# Patient Record
Sex: Male | Born: 1965 | Race: White | Hispanic: No | Marital: Single | Smoking: Current every day smoker
Health system: Southern US, Community
[De-identification: ages and names within clinical notes are randomized; demographics above are authoritative.]

## PROBLEM LIST (undated history)

## (undated) DIAGNOSIS — Z915 Personal history of self-harm: Secondary | ICD-10-CM

## (undated) DIAGNOSIS — F1920 Other psychoactive substance dependence, uncomplicated: Secondary | ICD-10-CM

## (undated) DIAGNOSIS — F319 Bipolar disorder, unspecified: Secondary | ICD-10-CM

## (undated) DIAGNOSIS — F259 Schizoaffective disorder, unspecified: Secondary | ICD-10-CM

## (undated) HISTORY — PX: ANKLE FRACTURE SURGERY: SHX122

---

## 2014-10-23 DIAGNOSIS — Z9151 Personal history of suicidal behavior: Secondary | ICD-10-CM

## 2014-10-23 HISTORY — DX: Personal history of suicidal behavior: Z91.51

## 2015-02-23 ENCOUNTER — Encounter (HOSPITAL_COMMUNITY): Payer: Self-pay | Admitting: *Deleted

## 2015-02-23 ENCOUNTER — Emergency Department (HOSPITAL_COMMUNITY): Payer: Medicare HMO

## 2015-02-23 ENCOUNTER — Inpatient Hospital Stay (HOSPITAL_COMMUNITY): Payer: Medicare HMO

## 2015-02-23 ENCOUNTER — Inpatient Hospital Stay (HOSPITAL_COMMUNITY)
Admission: EM | Admit: 2015-02-23 | Discharge: 2015-03-25 | DRG: 020 | Disposition: E | Payer: Medicare HMO | Attending: Neurology | Admitting: Neurology

## 2015-02-23 DIAGNOSIS — R451 Restlessness and agitation: Secondary | ICD-10-CM | POA: Diagnosis not present

## 2015-02-23 DIAGNOSIS — Z89611 Acquired absence of right leg above knee: Secondary | ICD-10-CM | POA: Diagnosis not present

## 2015-02-23 DIAGNOSIS — R4020 Unspecified coma: Secondary | ICD-10-CM | POA: Diagnosis not present

## 2015-02-23 DIAGNOSIS — Q282 Arteriovenous malformation of cerebral vessels: Secondary | ICD-10-CM | POA: Diagnosis not present

## 2015-02-23 DIAGNOSIS — Z452 Encounter for adjustment and management of vascular access device: Secondary | ICD-10-CM

## 2015-02-23 DIAGNOSIS — F319 Bipolar disorder, unspecified: Secondary | ICD-10-CM | POA: Diagnosis present

## 2015-02-23 DIAGNOSIS — J9600 Acute respiratory failure, unspecified whether with hypoxia or hypercapnia: Secondary | ICD-10-CM | POA: Diagnosis present

## 2015-02-23 DIAGNOSIS — Z515 Encounter for palliative care: Secondary | ICD-10-CM | POA: Diagnosis present

## 2015-02-23 DIAGNOSIS — G911 Obstructive hydrocephalus: Secondary | ICD-10-CM | POA: Diagnosis present

## 2015-02-23 DIAGNOSIS — Z9289 Personal history of other medical treatment: Secondary | ICD-10-CM

## 2015-02-23 DIAGNOSIS — I619 Nontraumatic intracerebral hemorrhage, unspecified: Secondary | ICD-10-CM | POA: Diagnosis present

## 2015-02-23 DIAGNOSIS — I611 Nontraumatic intracerebral hemorrhage in hemisphere, cortical: Secondary | ICD-10-CM | POA: Diagnosis not present

## 2015-02-23 DIAGNOSIS — I739 Peripheral vascular disease, unspecified: Secondary | ICD-10-CM

## 2015-02-23 DIAGNOSIS — I729 Aneurysm of unspecified site: Secondary | ICD-10-CM

## 2015-02-23 DIAGNOSIS — G969 Disorder of central nervous system, unspecified: Secondary | ICD-10-CM

## 2015-02-23 DIAGNOSIS — G936 Cerebral edema: Secondary | ICD-10-CM | POA: Diagnosis present

## 2015-02-23 DIAGNOSIS — I10 Essential (primary) hypertension: Secondary | ICD-10-CM | POA: Diagnosis present

## 2015-02-23 DIAGNOSIS — G8194 Hemiplegia, unspecified affecting left nondominant side: Secondary | ICD-10-CM | POA: Diagnosis present

## 2015-02-23 DIAGNOSIS — Z79899 Other long term (current) drug therapy: Secondary | ICD-10-CM | POA: Diagnosis not present

## 2015-02-23 DIAGNOSIS — I67848 Other cerebrovascular vasospasm and vasoconstriction: Secondary | ICD-10-CM | POA: Diagnosis not present

## 2015-02-23 DIAGNOSIS — I609 Nontraumatic subarachnoid hemorrhage, unspecified: Secondary | ICD-10-CM | POA: Diagnosis not present

## 2015-02-23 DIAGNOSIS — I615 Nontraumatic intracerebral hemorrhage, intraventricular: Secondary | ICD-10-CM | POA: Diagnosis present

## 2015-02-23 DIAGNOSIS — I629 Nontraumatic intracranial hemorrhage, unspecified: Secondary | ICD-10-CM

## 2015-02-23 DIAGNOSIS — G459 Transient cerebral ischemic attack, unspecified: Secondary | ICD-10-CM | POA: Diagnosis not present

## 2015-02-23 DIAGNOSIS — R404 Transient alteration of awareness: Secondary | ICD-10-CM

## 2015-02-23 DIAGNOSIS — Z66 Do not resuscitate: Secondary | ICD-10-CM | POA: Diagnosis not present

## 2015-02-23 DIAGNOSIS — J9601 Acute respiratory failure with hypoxia: Secondary | ICD-10-CM | POA: Diagnosis not present

## 2015-02-23 DIAGNOSIS — E876 Hypokalemia: Secondary | ICD-10-CM | POA: Diagnosis present

## 2015-02-23 DIAGNOSIS — I959 Hypotension, unspecified: Secondary | ICD-10-CM | POA: Diagnosis present

## 2015-02-23 DIAGNOSIS — G935 Compression of brain: Secondary | ICD-10-CM | POA: Diagnosis present

## 2015-02-23 DIAGNOSIS — Z4659 Encounter for fitting and adjustment of other gastrointestinal appliance and device: Secondary | ICD-10-CM

## 2015-02-23 DIAGNOSIS — I602 Nontraumatic subarachnoid hemorrhage from anterior communicating artery: Secondary | ICD-10-CM | POA: Diagnosis present

## 2015-02-23 DIAGNOSIS — F259 Schizoaffective disorder, unspecified: Secondary | ICD-10-CM | POA: Diagnosis present

## 2015-02-23 DIAGNOSIS — R29818 Other symptoms and signs involving the nervous system: Secondary | ICD-10-CM

## 2015-02-23 DIAGNOSIS — Z09 Encounter for follow-up examination after completed treatment for conditions other than malignant neoplasm: Secondary | ICD-10-CM

## 2015-02-23 HISTORY — DX: Schizoaffective disorder, unspecified: F25.9

## 2015-02-23 HISTORY — DX: Other psychoactive substance dependence, uncomplicated: F19.20

## 2015-02-23 HISTORY — DX: Bipolar disorder, unspecified: F31.9

## 2015-02-23 HISTORY — DX: Personal history of self-harm: Z91.5

## 2015-02-23 LAB — COMPREHENSIVE METABOLIC PANEL
ALK PHOS: 98 U/L (ref 38–126)
ALT: 17 U/L (ref 17–63)
ANION GAP: 9 (ref 5–15)
AST: 22 U/L (ref 15–41)
Albumin: 4 g/dL (ref 3.5–5.0)
BUN: 10 mg/dL (ref 6–20)
CALCIUM: 8.4 mg/dL — AB (ref 8.9–10.3)
CO2: 20 mmol/L — ABNORMAL LOW (ref 22–32)
CREATININE: 0.93 mg/dL (ref 0.61–1.24)
Chloride: 106 mmol/L (ref 101–111)
Glucose, Bld: 192 mg/dL — ABNORMAL HIGH (ref 65–99)
Potassium: 3.4 mmol/L — ABNORMAL LOW (ref 3.5–5.1)
Sodium: 135 mmol/L (ref 135–145)
TOTAL PROTEIN: 7.8 g/dL (ref 6.5–8.1)
Total Bilirubin: 0.5 mg/dL (ref 0.3–1.2)

## 2015-02-23 LAB — CBC WITH DIFFERENTIAL/PLATELET
Basophils Absolute: 0.1 10*3/uL (ref 0.0–0.1)
Basophils Relative: 0 %
EOS ABS: 0.3 10*3/uL (ref 0.0–0.7)
EOS PCT: 2 %
HCT: 47.7 % (ref 39.0–52.0)
HEMOGLOBIN: 16.5 g/dL (ref 13.0–17.0)
LYMPHS ABS: 3.7 10*3/uL (ref 0.7–4.0)
LYMPHS PCT: 21 %
MCH: 30.7 pg (ref 26.0–34.0)
MCHC: 34.6 g/dL (ref 30.0–36.0)
MCV: 88.7 fL (ref 78.0–100.0)
MONOS PCT: 10 %
Monocytes Absolute: 1.7 10*3/uL — ABNORMAL HIGH (ref 0.1–1.0)
NEUTROS PCT: 67 %
Neutro Abs: 11.6 10*3/uL — ABNORMAL HIGH (ref 1.7–7.7)
Platelets: 212 10*3/uL (ref 150–400)
RBC: 5.38 MIL/uL (ref 4.22–5.81)
RDW: 14.2 % (ref 11.5–15.5)
WBC: 17.4 10*3/uL — AB (ref 4.0–10.5)

## 2015-02-23 LAB — URINALYSIS, ROUTINE W REFLEX MICROSCOPIC
BILIRUBIN URINE: NEGATIVE
GLUCOSE, UA: NEGATIVE mg/dL
HGB URINE DIPSTICK: NEGATIVE
Leukocytes, UA: NEGATIVE
Nitrite: NEGATIVE
PROTEIN: NEGATIVE mg/dL
Specific Gravity, Urine: 1.02 (ref 1.005–1.030)
UROBILINOGEN UA: 0.2 mg/dL (ref 0.0–1.0)
pH: 5.5 (ref 5.0–8.0)

## 2015-02-23 LAB — PROTIME-INR
INR: 1.11 (ref 0.00–1.49)
PROTHROMBIN TIME: 14.5 s (ref 11.6–15.2)

## 2015-02-23 LAB — ETHANOL: ALCOHOL ETHYL (B): 8 mg/dL — AB (ref <5)

## 2015-02-23 LAB — I-STAT CG4 LACTIC ACID, ED: LACTIC ACID, VENOUS: 1.51 mmol/L (ref 0.5–2.0)

## 2015-02-23 LAB — RAPID URINE DRUG SCREEN, HOSP PERFORMED
AMPHETAMINES: NOT DETECTED
Barbiturates: NOT DETECTED
Benzodiazepines: NOT DETECTED
Cocaine: NOT DETECTED
Opiates: NOT DETECTED
Tetrahydrocannabinol: NOT DETECTED

## 2015-02-23 LAB — TROPONIN I

## 2015-02-23 LAB — AMMONIA: AMMONIA: 30 umol/L (ref 9–35)

## 2015-02-23 MED ORDER — ETOMIDATE 2 MG/ML IV SOLN
INTRAVENOUS | Status: AC
  Filled 2015-02-23: qty 10

## 2015-02-23 MED ORDER — SUCCINYLCHOLINE CHLORIDE 20 MG/ML IJ SOLN
INTRAMUSCULAR | Status: AC
  Administered 2015-02-23: 150 mg
  Filled 2015-02-23: qty 1

## 2015-02-23 MED ORDER — PROPOFOL 1000 MG/100ML IV EMUL: 5.0000 ug/kg/min | Freq: Once | INTRAVENOUS | Status: DC

## 2015-02-23 MED ORDER — LEVETIRACETAM IN NACL 1000 MG/100ML IV SOLN
INTRAVENOUS | Status: AC
  Administered 2015-02-23: 1000 mg
  Filled 2015-02-23: qty 100

## 2015-02-23 MED ORDER — ROCURONIUM BROMIDE 50 MG/5ML IV SOLN
INTRAVENOUS | Status: AC
  Filled 2015-02-23: qty 2

## 2015-02-23 MED ORDER — ETOMIDATE 2 MG/ML IV SOLN
INTRAVENOUS | Status: AC
  Administered 2015-02-23: 30 mg
  Filled 2015-02-23: qty 20

## 2015-02-23 MED ORDER — PROPOFOL 1000 MG/100ML IV EMUL
INTRAVENOUS | Status: AC
  Filled 2015-02-23: qty 100

## 2015-02-23 MED ORDER — LIDOCAINE HCL (CARDIAC) 20 MG/ML IV SOLN
INTRAVENOUS | Status: AC
  Administered 2015-02-24: 5 mg
  Filled 2015-02-23: qty 5

## 2015-02-23 NOTE — ED Provider Notes (Signed)
CSN: [id number]     Arrival date & time 03/08/2015  2059 History  By signing my name below, I, Doreatha Martin, attest that this documentation has been prepared under the direction and in the presence of Gilda Crease, MD. Electronically Signed: Doreatha Martin, ED Scribe. 03/11/2015. 9:15 PM.    Chief Complaint  Patient presents with  . Seizures   The history is provided by the EMS personnel. The history is limited by the condition of the patient. No language interpreter was used.   LEVEL 5 CAVEAT: HPI and ROS limited due to pt condition   HPI Comments: Dennis Anthony is a 49 y.o. male with Hx of Bipolar disorder, schizophrenia, polysubstance dependence, Right AKA who presents to the Emergency Department complaining due to a brief episode of LOC and shaking that occurred tonight. Per EMS, whether or not the seizure-like activity was witnessed is ambiguous. They state the "witness" of the event stated the pt shook for 5 minutes and became unresponsive. He was unresponsive when the EMS arrived. En route, he was given Zofran, his airway was suctioned once and he had 2 episodes of emesis. GBG was 117 and BP was 156/110 with sinus tach rhythm. [facility] from [facility].   Past Medical History  Diagnosis Date  . Bipolar disorder (HCC)   . Schizoaffective disorder (HCC)   . Polysubstance dependence (HCC)    History reviewed. No pertinent past surgical history. History reviewed. No pertinent family history. Social History  Substance Use Topics  . Smoking status: Unknown If Ever Smoked  . Smokeless tobacco: None  . Alcohol Use: None    Review of Systems  Unable to perform ROS: Patient unresponsive   Allergies  Review of patient's allergies indicates no known allergies.  Home Medications   Prior to Admission medications   Medication Sig Start Date End Date Taking? Authorizing Provider  clonazePAM (KLONOPIN) 0.5 MG tablet Take 0.5 mg by mouth 2 (two) times daily as needed for  anxiety.   Yes Historical Provider, MD  cloZAPine (CLOZARIL) 100 MG tablet Take 500 mg by mouth at bedtime.   Yes Historical Provider, MD  docusate sodium (COLACE) 100 MG capsule Take 100 mg by mouth daily.   Yes Historical Provider, MD  ibuprofen (ADVIL,MOTRIN) 200 MG tablet Take 400 mg by mouth every 4 (four) hours as needed for mild pain.   Yes Historical Provider, MD  omeprazole (PRILOSEC) 20 MG capsule Take 20 mg by mouth daily.   Yes Historical Provider, MD  risperiDONE (RISPERDAL) 1 MG tablet Take 1 mg by mouth 2 (two) times daily.   Yes Historical Provider, MD   BP 148/103 mmHg  Pulse 105  Temp(Src) 98.2 F (36.8 C) (Rectal)  Resp 31  Wt 180 lb (81.647 kg)  SpO2 98% Physical Exam  Constitutional: He appears well-developed and well-nourished. No distress.  HENT:  Head: Normocephalic and atraumatic.  Right Ear: Hearing normal.  Left Ear: Hearing normal.  Nose: Nose normal.  Mouth/Throat: Oropharynx is clear and moist and mucous membranes are normal.  Eyes: Conjunctivae and EOM are normal. Pupils are equal, round, and reactive to light.  Neck: Normal range of motion. Neck supple.  Cardiovascular: Regular rhythm.  Exam reveals no gallop and no friction rub.   No murmur heard. Pulmonary/Chest: Effort normal and breath sounds normal. No respiratory distress. He exhibits no tenderness.  Abdominal: Soft. Normal appearance and bowel sounds are normal. There is no hepatosplenomegaly. There is no tenderness. There is no  rebound, no guarding, no tenderness at McBurney's point and negative Murphy's sign. No hernia.  Musculoskeletal: Normal range of motion.  Neurological: He is alert. He has normal strength.  Non- communicative   Skin: Skin is warm and dry. No rash noted.  Psychiatric: He has a normal mood and affect. His behavior is normal. Thought content normal.  Nursing note and vitals reviewed.  ED Course  INTUBATION Date/Time: 03/05/2015 11:33 PM Performed by: Gilda Crease Authorized by: Gilda Crease Consent: The procedure was performed in an emergent situation. Patient identity confirmed: arm band and hospital-assigned identification number Time out: Immediately prior to procedure a "time out" was called to verify the correct patient, procedure, equipment, support staff and site/side marked as required. Indications: airway protection Intubation method: direct Patient status: paralyzed (RSI) Preoxygenation: BVM Sedatives: etomidate Paralytic: succinylcholine Laryngoscope size: Mac 4 Tube size: 7.5 mm Tube type: cuffed Number of attempts: 1 Breath sounds: equal and absent over the epigastrium Cuff inflated: yes Tube secured with: ETT holder   (including critical care time) DIAGNOSTIC STUDIES: Oxygen Saturation is 98% on RA, normal by my interpretation.    COORDINATION OF CARE: 9:06 PM Discussed treatment plan with pt at bedside and pt agreed to plan.   Labs Review Labs Reviewed  CBC WITH DIFFERENTIAL/PLATELET - Abnormal; Notable for the following:    WBC 17.4 (*)    Neutro Abs 11.6 (*)    Monocytes Absolute 1.7 (*)    All other components within normal limits  COMPREHENSIVE METABOLIC PANEL - Abnormal; Notable for the following:    Potassium 3.4 (*)    CO2 20 (*)    Glucose, Bld 192 (*)    Calcium 8.4 (*)    All other components within normal limits  URINALYSIS, ROUTINE W REFLEX MICROSCOPIC (NOT AT [facility]) - Abnormal; Notable for the following:    Ketones, ur TRACE (*)    All other components within normal limits  ETHANOL - Abnormal; Notable for the following:    Alcohol, Ethyl (B) 8 (*)    All other components within normal limits  URINE RAPID DRUG SCREEN, HOSP PERFORMED  TROPONIN I  AMMONIA  PROTIME-INR  I-STAT CG4 LACTIC ACID, ED    Imaging Review Dg Chest 2 View  03/10/2015   CLINICAL DATA:  Question of seizure. Patient unresponsive. Initial encounter.  EXAM: CHEST  2 VIEW  COMPARISON:  None.  FINDINGS: The  lungs are well-aerated and clear. There is no evidence of focal opacification, pleural effusion or pneumothorax.  The heart is borderline normal in size. No acute osseous abnormalities are seen. Degenerative change is noted about the distal right clavicle, likely reflecting remote injury, with heterotopic bone formation about the coracoclavicular ligament. Multiple chronic left-sided rib deformities are seen.  IMPRESSION: 1. No acute cardiopulmonary process seen. 2. Multiple chronic left-sided rib deformities noted.   Electronically Signed   By: Roanna Raider M.D.   On: 03/03/2015 23:12   Ct Head Wo Contrast  03/18/2015   CLINICAL DATA:  Possible seizure, shaking episode of for 5 minutes and then unresponsive. Vomiting. History of bipolar, polysubstance abuse.  EXAM: CT HEAD WITHOUT CONTRAST  TECHNIQUE: Contiguous axial images were obtained from the base of the skull through the vertex without intravenous contrast.  COMPARISON:  None.  FINDINGS: 6.6 x 3.8 cm RIGHT inferior frontal lobe intraparenchymal hematoma with surrounding low-density vasogenic edema, effacing the sulci. Intraventricular extension, with effacement of the RIGHT frontal horn of the lateral ventricle. No hydrocephalus. 6 mm  RIGHT to LEFT subfalcine herniation. Trace amount of subarachnoid hemorrhage suspected RIGHT sylvian fissure, though this is contiguous with linear densities and punctate calcification RIGHT parietal lobe suggesting AVM. Mildly dense intracranial vessels compatible with hemoconcentration.  No acute large vascular territory infarcts. Basal cisterns are patent.  Small RIGHT maxillary mucosal retention cyst without paranasal sinus air-fluid levels. The mastoid air cells are well aerated. No skull fracture. Ocular globes and orbital contents are normal. Patient is edentulous.  IMPRESSION: 6.6 x 3.8 cm hematoma, resulting in 6 mm RIGHT to LEFT subfalcine herniation.  Intraventricular extension without hydrocephalus.  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  Acute findings discussed with and reconfirmed by Dr.RANCOUR on Mar 10, 2015 at 11:14 pm.   Electronically Signed   By: Awilda Metro M.D.   On: 2015-03-10 23:15   I have personally reviewed and evaluated these images and lab results; EKG as part of my medical decision-making.   EKG Interpretation   Date/Time:  Sunday 03-10-15 21:11:04 EDT Ventricular Rate:  109 PR Interval:  147 QRS Duration: 86 QT Interval:  345 QTC Calculation: 465 R Axis:   55 Text Interpretation:  Sinus tachycardia Otherwise within normal limits  Confirmed by POLLINA  MD, CHRISTOPHER [phone]) on 03/10/2015 9:15:00 PM      MDM   Final diagnoses:  Hemorrhagic stroke [facility])   Patient presented to the ER after what was described as a seizure. Patient does not have a history of seizures. Patient was somnolent at arrival but did become more alert over time. As he became more alert, however, he was noted to have right gaze preference was not moving his left side. CT scan did show large right frontal intraparenchymal hematoma with vasogenic edema which explains his symptoms. Patient was intubated for airway protection. Recent sedated with the plan. He was administered IV Keppra.  Patient was discussed briefly with Dr. Nicholaus Bloom. He reviewed the images and did not feel the patient required surgical intervention tonight.  Patient discussed with Dr. Hosie Poisson, on call for neurology. He has accepted the patient for transfer to the neuro ICU.  I did discuss the patient's condition with his brother, Dennis Anthony [phone]. He understands the critical nature of the patient's condition. He did confirm that he would like [place] to "do everything we could".  CRITICAL CARE Performed by: Gilda Crease   Total critical care time:  Critical care time was exclusive of separately billable procedures and treating other patients.  Critical care  was necessary to treat or prevent imminent or life-threatening deterioration.  Critical care was time spent personally by me on the following activities: development of treatment plan with patient and/or surrogate as well as nursing, discussions with consultants, evaluation of patient's response to treatment, examination of patient, obtaining history from patient or surrogate, ordering and performing treatments and interventions, ordering and review of laboratory studies, ordering and review of radiographic studies, pulse oximetry and re-evaluation of patient's condition.   I personally performed the services described in this documentation, which was scribed in my presence. The recorded information has been reviewed and is accurate.   Gilda Crease, MD 03/10/2015 2337

## 2015-02-23 NOTE — ED Notes (Signed)
Dr.Pollina contacted  Brother Dennis Anthony and made him aware of his condition

## 2015-02-23 NOTE — ED Notes (Signed)
Gold/silver tone [employer] watch placed in biohazard bag with pt label and placed in pt belongings bag with purple towel.

## 2015-02-23 NOTE — ED Notes (Signed)
Pt brought in by [facility] for c/o possible seizure; ems states one person saw pt "shaking" for 5 minutes and then became unresponsive, pt vomited one time on the scene and another en route to hospital; pt was given  of Zofran en route; CBG 117 by [facility]; pt will open his eyes when his name is called

## 2015-02-23 NOTE — ED Notes (Signed)
Dr. Blinda Leatherwood is getting prepared to intubate there patient

## 2015-02-23 NOTE — ED Notes (Signed)
Report given to [facility]. 

## 2015-02-23 NOTE — ED Notes (Signed)
Patient  Remains no verbal but moans and groans.

## 2015-02-24 ENCOUNTER — Encounter (HOSPITAL_COMMUNITY): Admission: EM | Disposition: E | Payer: Self-pay | Source: Home / Self Care | Attending: Neurology

## 2015-02-24 ENCOUNTER — Inpatient Hospital Stay (HOSPITAL_COMMUNITY): Payer: Medicare HMO

## 2015-02-24 ENCOUNTER — Encounter (HOSPITAL_COMMUNITY): Payer: Self-pay | Admitting: Radiology

## 2015-02-24 ENCOUNTER — Inpatient Hospital Stay (HOSPITAL_COMMUNITY): Payer: Medicare HMO | Admitting: Anesthesiology

## 2015-02-24 DIAGNOSIS — I619 Nontraumatic intracerebral hemorrhage, unspecified: Secondary | ICD-10-CM | POA: Diagnosis present

## 2015-02-24 DIAGNOSIS — Q283 Other malformations of cerebral vessels: Secondary | ICD-10-CM

## 2015-02-24 DIAGNOSIS — I609 Nontraumatic subarachnoid hemorrhage, unspecified: Secondary | ICD-10-CM

## 2015-02-24 DIAGNOSIS — I611 Nontraumatic intracerebral hemorrhage in hemisphere, cortical: Secondary | ICD-10-CM

## 2015-02-24 HISTORY — PX: RADIOLOGY WITH ANESTHESIA: SHX6223

## 2015-02-24 HISTORY — PX: CRANIOTOMY: SHX93

## 2015-02-24 LAB — BLOOD GAS, ARTERIAL
Acid-Base Excess: 5.4 mmol/L — ABNORMAL HIGH (ref 0.0–2.0)
Bicarbonate: 20.2 mEq/L (ref 20.0–24.0)
DRAWN BY: 22223
FIO2: 40
MECHVT: 600 mL
O2 Saturation: 98.5 %
PEEP: 5 cmH2O
PH ART: 7.351 (ref 7.350–7.450)
PO2 ART: 160 mmHg — AB (ref 80.0–100.0)
Patient temperature: 37
RATE: 14 resp/min
TCO2: 19.4 mmol/L (ref 0–100)
pCO2 arterial: 35.5 mmHg (ref 35.0–45.0)

## 2015-02-24 LAB — GLUCOSE, CAPILLARY
GLUCOSE-CAPILLARY: 140 mg/dL — AB (ref 65–99)
Glucose-Capillary: 189 mg/dL — ABNORMAL HIGH (ref 65–99)

## 2015-02-24 LAB — POCT I-STAT 3, ART BLOOD GAS (G3+)
ACID-BASE DEFICIT: 3 mmol/L — AB (ref 0.0–2.0)
Bicarbonate: 21.5 mEq/L (ref 20.0–24.0)
O2 SAT: 98 %
PH ART: 7.357 (ref 7.350–7.450)
Patient temperature: 102.1
TCO2: 23 mmol/L (ref 0–100)
pCO2 arterial: 39.2 mmHg (ref 35.0–45.0)
pO2, Arterial: 128 mmHg — ABNORMAL HIGH (ref 80.0–100.0)

## 2015-02-24 LAB — SODIUM
SODIUM: 136 mmol/L (ref 135–145)
SODIUM: 135 mmol/L (ref 135–145)
Sodium: 142 mmol/L (ref 135–145)

## 2015-02-24 LAB — PREPARE RBC (CROSSMATCH)

## 2015-02-24 LAB — TRIGLYCERIDES: Triglycerides: 165 mg/dL — ABNORMAL HIGH (ref <150)

## 2015-02-24 LAB — ABO/RH: ABO/RH(D): O POS

## 2015-02-24 LAB — MRSA PCR SCREENING: MRSA BY PCR: NEGATIVE

## 2015-02-24 SURGERY — RADIOLOGY WITH ANESTHESIA
Anesthesia: General

## 2015-02-24 SURGERY — CRANIOTOMY INTRACRANIAL ANEURYSM FOR CAROTID
Anesthesia: General | Site: Head | Laterality: Right

## 2015-02-24 MED ORDER — ROCURONIUM BROMIDE 50 MG/5ML IV SOLN
INTRAVENOUS | Status: AC
  Filled 2015-02-24: qty 2

## 2015-02-24 MED ORDER — PROPOFOL 10 MG/ML IV BOLUS
INTRAVENOUS | Status: DC | PRN
  Administered 2015-02-24: 30 mg via INTRAVENOUS

## 2015-02-24 MED ORDER — FENTANYL CITRATE (PF) 250 MCG/5ML IJ SOLN
INTRAMUSCULAR | Status: AC
  Filled 2015-02-24: qty 5

## 2015-02-24 MED ORDER — SODIUM CHLORIDE 0.9 % IV SOLN
INTRAVENOUS | Status: DC | PRN
  Administered 2015-02-24 (×2): via INTRAVENOUS

## 2015-02-24 MED ORDER — CHLORHEXIDINE GLUCONATE 0.12% ORAL RINSE (MEDLINE KIT)
15.0000 mL | Freq: Two times a day (BID) | OROMUCOSAL | Status: DC
  Administered 2015-02-24 – 2015-03-01 (×12): 15 mL via OROMUCOSAL

## 2015-02-24 MED ORDER — THROMBIN 20000 UNITS EX SOLR
CUTANEOUS | Status: DC | PRN
  Administered 2015-02-24: 16:00:00 via TOPICAL

## 2015-02-24 MED ORDER — THROMBIN 5000 UNITS EX SOLR
OROMUCOSAL | Status: DC | PRN
  Administered 2015-02-24: 15:00:00 via TOPICAL

## 2015-02-24 MED ORDER — LABETALOL HCL 5 MG/ML IV SOLN
INTRAVENOUS | Status: AC
  Filled 2015-02-24: qty 4

## 2015-02-24 MED ORDER — SODIUM CHLORIDE 0.9 % IV SOLN
500.0000 mg | Freq: Two times a day (BID) | INTRAVENOUS | Status: DC
  Administered 2015-02-24 – 2015-03-03 (×15): 500 mg via INTRAVENOUS
  Filled 2015-02-24 (×18): qty 5

## 2015-02-24 MED ORDER — INDOCYANINE GREEN 25 MG IV SOLR
5.0000 mg | Freq: Once | INTRAVENOUS | Status: DC
  Filled 2015-02-24: qty 25

## 2015-02-24 MED ORDER — MANNITOL 25 % IV SOLN
INTRAVENOUS | Status: DC | PRN
  Administered 2015-02-24: 25 g via INTRAVENOUS

## 2015-02-24 MED ORDER — LABETALOL HCL 5 MG/ML IV SOLN
INTRAVENOUS | Status: DC | PRN
  Administered 2015-02-24 (×5): 10 mg via INTRAVENOUS

## 2015-02-24 MED ORDER — INDOCYANINE GREEN 25 MG IV SOLR
INTRAVENOUS | Status: DC | PRN
  Administered 2015-02-24: 12.5 mg via INTRAVENOUS

## 2015-02-24 MED ORDER — LIDOCAINE HCL (PF) 1 % IJ SOLN
INTRAMUSCULAR | Status: DC | PRN
  Administered 2015-02-24: 10 mL

## 2015-02-24 MED ORDER — NICARDIPINE HCL IN NACL 20-0.86 MG/200ML-% IV SOLN
3.0000 mg/h | INTRAVENOUS | Status: DC
  Administered 2015-02-24: 2 mg/h via INTRAVENOUS
  Filled 2015-02-24: qty 200

## 2015-02-24 MED ORDER — SODIUM CHLORIDE 0.9 % IV SOLN
Freq: Once | INTRAVENOUS | Status: AC
  Administered 2015-02-24: 15:00:00 via INTRAVENOUS

## 2015-02-24 MED ORDER — SODIUM CHLORIDE 0.9 % IR SOLN
Status: DC | PRN
  Administered 2015-02-24: 16:00:00

## 2015-02-24 MED ORDER — SODIUM CHLORIDE 3 % IV SOLN
INTRAVENOUS | Status: DC
  Administered 2015-02-24: 50 mL/h via INTRAVENOUS
  Administered 2015-02-24 – 2015-02-27 (×8): 70 mL/h via INTRAVENOUS
  Administered 2015-03-01: 50 mL/h via INTRAVENOUS
  Filled 2015-02-24 (×22): qty 500

## 2015-02-24 MED ORDER — MICROFIBRILLAR COLL HEMOSTAT EX PADS
MEDICATED_PAD | CUTANEOUS | Status: DC | PRN
  Administered 2015-02-24: 1 via TOPICAL

## 2015-02-24 MED ORDER — ACETAMINOPHEN 650 MG RE SUPP: 650.0000 mg | RECTAL | Status: DC | PRN

## 2015-02-24 MED ORDER — SODIUM CHLORIDE 0.9 % IV SOLN: 1000.0000 mg | Freq: Once | INTRAVENOUS | Status: DC

## 2015-02-24 MED ORDER — LABETALOL HCL 5 MG/ML IV SOLN
10.0000 mg | INTRAVENOUS | Status: DC | PRN
  Administered 2015-02-24: 20 mg via INTRAVENOUS
  Administered 2015-02-25: 10 mg via INTRAVENOUS
  Filled 2015-02-24: qty 4
  Filled 2015-02-24: qty 8

## 2015-02-24 MED ORDER — CEFAZOLIN SODIUM-DEXTROSE 2-3 GM-% IV SOLR
2.0000 g | Freq: Once | INTRAVENOUS | Status: AC
  Administered 2015-02-24 (×2): 2 g via INTRAVENOUS
  Filled 2015-02-24: qty 50

## 2015-02-24 MED ORDER — IOHEXOL 350 MG/ML SOLN
50.0000 mL | Freq: Once | INTRAVENOUS | Status: AC | PRN
  Administered 2015-02-24: 50 mL via INTRAVENOUS

## 2015-02-24 MED ORDER — 0.9 % SODIUM CHLORIDE (POUR BTL) OPTIME
TOPICAL | Status: DC | PRN
  Administered 2015-02-24 (×3): 1000 mL

## 2015-02-24 MED ORDER — PANTOPRAZOLE SODIUM 40 MG IV SOLR
40.0000 mg | Freq: Every day | INTRAVENOUS | Status: DC
  Administered 2015-02-24 – 2015-03-02 (×8): 40 mg via INTRAVENOUS
  Filled 2015-02-24 (×8): qty 40

## 2015-02-24 MED ORDER — PROMETHAZINE HCL 25 MG/ML IJ SOLN: 6.2500 mg | INTRAMUSCULAR | Status: DC | PRN

## 2015-02-24 MED ORDER — STROKE: EARLY STAGES OF RECOVERY BOOK
Freq: Once | Status: AC
  Administered 2015-02-24: 1
  Filled 2015-02-24: qty 1

## 2015-02-24 MED ORDER — SODIUM CHLORIDE 23.4 % INJECTION (4 MEQ/ML) FOR IV ADMINISTRATION
30.0000 mL | Freq: Once | INTRAVENOUS | Status: AC
  Administered 2015-02-24: 30 mL via INTRAVENOUS
  Filled 2015-02-24: qty 30

## 2015-02-24 MED ORDER — ACETAMINOPHEN 325 MG PO TABS
650.0000 mg | ORAL_TABLET | ORAL | Status: DC | PRN
  Administered 2015-02-24 – 2015-03-02 (×23): 650 mg via ORAL
  Filled 2015-02-24 (×23): qty 2

## 2015-02-24 MED ORDER — ANTISEPTIC ORAL RINSE SOLUTION (CORINZ)
7.0000 mL | OROMUCOSAL | Status: DC
  Administered 2015-02-24 – 2015-03-01 (×50): 7 mL via OROMUCOSAL

## 2015-02-24 MED ORDER — CEFAZOLIN SODIUM-DEXTROSE 2-3 GM-% IV SOLR
INTRAVENOUS | Status: AC
  Filled 2015-02-24: qty 50

## 2015-02-24 MED ORDER — FENTANYL CITRATE (PF) 100 MCG/2ML IJ SOLN
INTRAMUSCULAR | Status: DC | PRN
  Administered 2015-02-24: 100 ug via INTRAVENOUS
  Administered 2015-02-24: 150 ug via INTRAVENOUS
  Administered 2015-02-24: 250 ug via INTRAVENOUS
  Administered 2015-02-24: 100 ug via INTRAVENOUS
  Administered 2015-02-24: 50 ug via INTRAVENOUS
  Administered 2015-02-24: 100 ug via INTRAVENOUS
  Administered 2015-02-24: 50 ug via INTRAVENOUS
  Administered 2015-02-24: 250 ug via INTRAVENOUS
  Administered 2015-02-24: 100 ug via INTRAVENOUS
  Administered 2015-02-24: 250 ug via INTRAVENOUS
  Administered 2015-02-24: 100 ug via INTRAVENOUS

## 2015-02-24 MED ORDER — DEXAMETHASONE SODIUM PHOSPHATE 4 MG/ML IJ SOLN
8.0000 mg | Freq: Four times a day (QID) | INTRAMUSCULAR | Status: DC
  Administered 2015-02-24: 8 mg via INTRAVENOUS
  Filled 2015-02-24: qty 2

## 2015-02-24 MED ORDER — ROCURONIUM BROMIDE 100 MG/10ML IV SOLN
INTRAVENOUS | Status: DC | PRN
  Administered 2015-02-24: 30 mg via INTRAVENOUS
  Administered 2015-02-24 (×3): 50 mg via INTRAVENOUS
  Administered 2015-02-24: 20 mg via INTRAVENOUS

## 2015-02-24 MED ORDER — BACITRACIN ZINC 500 UNIT/GM EX OINT
TOPICAL_OINTMENT | CUTANEOUS | Status: DC | PRN
  Administered 2015-02-24: 1 via TOPICAL

## 2015-02-24 MED ORDER — SENNOSIDES-DOCUSATE SODIUM 8.6-50 MG PO TABS
1.0000 | ORAL_TABLET | Freq: Two times a day (BID) | ORAL | Status: DC
  Administered 2015-02-24 – 2015-02-26 (×5): 1 via ORAL
  Filled 2015-02-24 (×5): qty 1

## 2015-02-24 MED ORDER — PROPOFOL 1000 MG/100ML IV EMUL
5.0000 ug/kg/min | INTRAVENOUS | Status: DC
  Administered 2015-02-24: 40 ug/kg/min via INTRAVENOUS
  Filled 2015-02-24: qty 100

## 2015-02-24 SURGICAL SUPPLY — 87 items
BANDAGE GAUZE 4  KLING STR (GAUZE/BANDAGES/DRESSINGS) ×6 IMPLANT
BATTERY IQ STERILE (MISCELLANEOUS) ×3 IMPLANT
BENZOIN TINCTURE PRP APPL 2/3 (GAUZE/BANDAGES/DRESSINGS) IMPLANT
BIT DRILL WIRE PASS 1.3MM (BIT) IMPLANT
BLADE SAW GIGLI 16 STRL (MISCELLANEOUS) IMPLANT
BLADE SURG 15 STRL LF DISP TIS (BLADE) ×1 IMPLANT
BLADE SURG 15 STRL SS (BLADE) ×2
BLADE ULTRA TIP 2M (BLADE) IMPLANT
BNDG GAUZE ELAST 4 BULKY (GAUZE/BANDAGES/DRESSINGS) ×6 IMPLANT
BRUSH SCRUB EZ 1% IODOPHOR (MISCELLANEOUS) IMPLANT
BRUSH SCRUB EZ PLAIN DRY (MISCELLANEOUS) ×3 IMPLANT
BUR ACORN 6.0 PRECISION (BURR) ×2 IMPLANT
BUR ACORN 6.0MM PRECISION (BURR) ×1
BUR ADDG 1.1 (BURR) IMPLANT
BUR ADDG 1.1MM (BURR)
BUR MATCHSTICK NEURO 3.0 LAGG (BURR) IMPLANT
BUR ROUND FLUTED 4 SOFT TCH (BURR) ×2 IMPLANT
BUR ROUND FLUTED 4MM SOFT TCH (BURR) ×1
BUR SPIRAL ROUTER 2.3 (BUR) ×2 IMPLANT
BUR SPIRAL ROUTER 2.3MM (BUR) ×1
CANISTER SUCT 3000ML PPV (MISCELLANEOUS) ×3 IMPLANT
CLIP ANEURY TI PERM STD ANG 8M (Clip) IMPLANT
CLIP ANEURY TIPERM STD CVD 9.0 (Clip) ×3 IMPLANT
CLIP TI MEDIUM 6 (CLIP) IMPLANT
CORDS BIPOLAR (ELECTRODE) IMPLANT
COVER MAYO STAND STRL (DRAPES) IMPLANT
DECANTER SPIKE VIAL GLASS SM (MISCELLANEOUS) IMPLANT
DRAIN SNY WOU 7FLT (WOUND CARE) IMPLANT
DRAPE MICROSCOPE LEICA (MISCELLANEOUS) ×3 IMPLANT
DRAPE NEUROLOGICAL W/INCISE (DRAPES) ×3 IMPLANT
DRAPE WARM FLUID 44X44 (DRAPE) ×3 IMPLANT
DRILL WIRE PASS 1.3MM (BIT)
DRSG ADAPTIC 3X8 NADH LF (GAUZE/BANDAGES/DRESSINGS) ×3 IMPLANT
DRSG TELFA 3X8 NADH (GAUZE/BANDAGES/DRESSINGS) ×3 IMPLANT
DURAMATRIX ONLAY 3X3 (Plate) ×3 IMPLANT
DURAPREP 6ML APPLICATOR 50/CS (WOUND CARE) ×3 IMPLANT
ELECT CAUTERY BLADE 6.4 (BLADE) IMPLANT
ELECT REM PT RETURN 9FT ADLT (ELECTROSURGICAL) ×3
ELECTRODE REM PT RTRN 9FT ADLT (ELECTROSURGICAL) ×1 IMPLANT
EVACUATOR SILICONE 100CC (DRAIN) IMPLANT
F810T Aneurysm Clip (Clip) ×3 IMPLANT
FORCEPS BIPOLAR SPETZLER 8 1.0 (NEUROSURGERY SUPPLIES) ×3 IMPLANT
GAUZE SPONGE 4X4 12PLY STRL (GAUZE/BANDAGES/DRESSINGS) IMPLANT
GAUZE SPONGE 4X4 16PLY XRAY LF (GAUZE/BANDAGES/DRESSINGS) IMPLANT
GLOVE BIOGEL PI IND STRL 7.5 (GLOVE) ×1 IMPLANT
GLOVE BIOGEL PI INDICATOR 7.5 (GLOVE) ×2
GLOVE ECLIPSE 7.0 STRL STRAW (GLOVE) ×6 IMPLANT
GOWN STRL REUS W/ TWL LRG LVL3 (GOWN DISPOSABLE) ×3 IMPLANT
GOWN STRL REUS W/ TWL XL LVL3 (GOWN DISPOSABLE) IMPLANT
GOWN STRL REUS W/TWL 2XL LVL3 (GOWN DISPOSABLE) IMPLANT
GOWN STRL REUS W/TWL LRG LVL3 (GOWN DISPOSABLE) ×6
GOWN STRL REUS W/TWL XL LVL3 (GOWN DISPOSABLE)
HEMOSTAT SURGICEL 2X14 (HEMOSTASIS) ×3 IMPLANT
HOOK DURA (MISCELLANEOUS) IMPLANT
KIT BASIN OR (CUSTOM PROCEDURE TRAY) ×3 IMPLANT
KIT DRAIN CSF ACCUDRAIN (MISCELLANEOUS) IMPLANT
KIT ROOM TURNOVER OR (KITS) ×3 IMPLANT
KNIFE ARACHNOID DISP AM-24-S (MISCELLANEOUS) ×3 IMPLANT
NEEDLE HYPO 25X1 1.5 SAFETY (NEEDLE) ×3 IMPLANT
NS IRRIG 1000ML POUR BTL (IV SOLUTION) ×9 IMPLANT
PACK CRANIOTOMY (CUSTOM PROCEDURE TRAY) ×3 IMPLANT
PAD ARMBOARD 7.5X6 YLW CONV (MISCELLANEOUS) ×6 IMPLANT
PATTIES SURGICAL .25X.25 (GAUZE/BANDAGES/DRESSINGS) IMPLANT
PATTIES SURGICAL .5 X.5 (GAUZE/BANDAGES/DRESSINGS) IMPLANT
PATTIES SURGICAL .5 X3 (DISPOSABLE) IMPLANT
PATTIES SURGICAL 1/4 X 3 (GAUZE/BANDAGES/DRESSINGS) IMPLANT
PATTIES SURGICAL 1X1 (DISPOSABLE) IMPLANT
PIN MAYFIELD SKULL DISP (PIN) ×3 IMPLANT
RUBBERBAND STERILE (MISCELLANEOUS) ×6 IMPLANT
SPONGE NEURO XRAY DETECT 1X3 (DISPOSABLE) IMPLANT
SPONGE SURGIFOAM ABS GEL 100 (HEMOSTASIS) ×3 IMPLANT
SPONGE SURGIFOAM ABS GEL 100C (HEMOSTASIS) IMPLANT
STAPLER VISISTAT 35W (STAPLE) ×3 IMPLANT
STOCKINETTE 6  STRL (DRAPES) ×2
STOCKINETTE 6 STRL (DRAPES) ×1 IMPLANT
SUT ETHILON 3 0 FSL (SUTURE) IMPLANT
SUT NURALON 4 0 TR CR/8 (SUTURE) ×6 IMPLANT
SUT VIC AB 0 CT1 18XCR BRD8 (SUTURE) ×2 IMPLANT
SUT VIC AB 0 CT1 8-18 (SUTURE) ×4
SUT VIC AB 3-0 SH 8-18 (SUTURE) ×9 IMPLANT
SYR CONTROL 10ML LL (SYRINGE) ×3 IMPLANT
SYR TB 1ML 25GX5/8 (SYRINGE) ×3 IMPLANT
TOWEL OR 17X24 6PK STRL BLUE (TOWEL DISPOSABLE) ×3 IMPLANT
TOWEL OR 17X26 10 PK STRL BLUE (TOWEL DISPOSABLE) ×3 IMPLANT
TRAY FOLEY W/METER SILVER 14FR (SET/KITS/TRAYS/PACK) IMPLANT
UNDERPAD 30X30 INCONTINENT (UNDERPADS AND DIAPERS) IMPLANT
WATER STERILE IRR 1000ML POUR (IV SOLUTION) ×3 IMPLANT

## 2015-02-24 NOTE — Procedures (Signed)
ELECTROENCEPHALOGRAM REPORT   Patient: Dennis Anthony       Room #: 4U98 EEG No. ID: [id number] Age: 49 y.o.        Sex: male Referring Physician: Pearlean Brownie Report Date:  02/27/15        Interpreting Physician: Thana Farr  History: Dennis Anthony is an 49 y.o. male with intracranial hemorrhage and possible seizure.    Medications:  Scheduled: . antiseptic oral rinse  7 mL Mouth Rinse 10 times per day  . chlorhexidine gluconate  15 mL Mouth Rinse BID  . labetalol      . levETIRAcetam  500 mg Intravenous Q12H  . pantoprazole (PROTONIX) IV  40 mg Intravenous QHS  . senna-docusate  1 tablet Oral BID    Conditions of Recording:  This is a 16 channel EEG carried out with the patient in the intubated and sedated state.  Description:  The background activity consists of a low voltage mixture of frequencies that is poorly organized.  The background activity is continuous but asymmetrical.  There is decreased amplitude over the right hemisphere as compared to the left hemisphere.  There is no evidence of normal sleep.   No epileptiform activity is noted.  Painful stimulation was applied bilaterally.  Although some withdrawal was noted clinically no change in the electroencephalogram was appreciated.  Hyperventilation and intermittent photic stimulation were not performed.   IMPRESSION: This is an abnormal electroencephalogram secondary to the decreased amplitude noted over the right hemisphere.  This is consistent with the patient's history of a right ICH.  No epileptiform activity is noted.     Thana Farr, MD [facility] [phone] February 27, 2015, 1:03 PM

## 2015-02-24 NOTE — Progress Notes (Signed)
EEG Completed; Results Pending  

## 2015-02-24 NOTE — Progress Notes (Signed)
Pt transported to IR by myself, Product manager, and [employer]. Bedside report given.

## 2015-02-24 NOTE — H&P (Addendum)
Stroke Consult    Chief Complaint: ICH  HPI: Dennis Anthony is an 49 y.o. male hx of bipolar disorder, polysubstance abuse transferred from [facility] ED after presenting with AMS and questionable seizure episode. BP upon arrival was 156/110. CT head performed at that time (imaging reviewed) which showed a 6.6 x 3.8 cm right inferior frontal lobe ICH with 6mm right-to-left subfalcine herniation. Patient was intubated in the ED for airway protection. Prior to intubation, ED attending noted patient was minimally following commands and moving right side. ED attending (Dr Blinda Leatherwood) discussed case and reviewed images with neurosurgery (Dr Othelia Pulling) who did not feel that the patient required immediate surgical intervention.   Date last known well: 03/08/2015 Time last known well: unclear tPA Given: no, ICH Modified Rankin: Rankin Score=0  ICH Score: 3   Past Medical History  Diagnosis Date  . Bipolar disorder (HCC)   . Schizoaffective disorder (HCC)   . Polysubstance dependence (HCC)     History reviewed. No pertinent past surgical history.  History reviewed. No pertinent family history. Social History:  has no tobacco, alcohol, and drug history on file.  Allergies: No Known Allergies  Medications Prior to Admission  Medication Sig Dispense Refill  . clonazePAM (KLONOPIN) 0.5 MG tablet Take 0.5 mg by mouth 2 (two) times daily as needed for anxiety.    . cloZAPine (CLOZARIL) 100 MG tablet Take 500 mg by mouth at bedtime.    . docusate sodium (COLACE) 100 MG capsule Take 100 mg by mouth daily.    Marland Kitchen ibuprofen (ADVIL,MOTRIN) 200 MG tablet Take 400 mg by mouth every 4 (four) hours as needed for mild pain.    Marland Kitchen omeprazole (PRILOSEC) 20 MG capsule Take 20 mg by mouth daily.    . risperiDONE (RISPERDAL) 1 MG tablet Take 1 mg by mouth 2 (two) times daily.      ROS: Out of a complete 14 system review, the patient complains of only the following symptoms, and all other reviewed systems are  negative. Unable to obtain   Physical Examination: Filed Vitals:   03/17/2015 0215  BP: 154/109  Pulse: 111  Temp:   Resp: 22   Physical Exam  Constitutional: He appears well-developed and well-nourished.  Psych: Affect appropriate to situation Eyes: No scleral injection HENT: No OP obstrucion Head: Normocephalic.  Cardiovascular: Normal rate and regular rhythm.  Respiratory: Effort normal and breath sounds normal.  GI: Soft. Bowel sounds are normal. No distension. There is no tenderness.  Skin: WDI   Neurologic Examination: Mental Status: Sedated, intubated. Eyes closed, non-verbal, not following commands Cranial Nerves: II: optic discs not visualized, no blink to threat, pupils equal, round, reactive to light  III,IV, VI: ptosis not present, eyes midline, dolls eye + V,VII: weak corneal reflex + VIII: unable to test IX,X: gag reflex present Motor: No spontaneous movement noted Sensory: weak withdrawal to noxious stimuli in RUE Deep Tendon Reflexes: 1+ throughout (has right AKA) Plantars: Right: right AKA   Left: downgoing Cerebellar: Unable to test Gait: unable to test  Laboratory Studies:   Basic Metabolic Panel:  Recent Labs Lab 03/03/2015 2121  NA 135  K 3.4*  CL 106  CO2 20*  GLUCOSE 192*  BUN 10  CREATININE 0.93  CALCIUM 8.4*    Liver Function Tests:  Recent Labs Lab 02/26/2015 2121  AST 22  ALT 17  ALKPHOS 98  BILITOT 0.5  PROT 7.8  ALBUMIN 4.0   No results for input(s): LIPASE, AMYLASE in the last 168  hours.  Recent Labs Lab 01-Mar-2015 2141  AMMONIA 30    CBC:  Recent Labs Lab March 01, 2015 2121  WBC 17.4*  NEUTROABS 11.6*  HGB 16.5  HCT 47.7  MCV 88.7  PLT 212    Cardiac Enzymes:  Recent Labs Lab 01-Mar-2015 2121  TROPONINI <0.03    BNP: Invalid input(s): POCBNP  CBG:  Recent Labs Lab 03/11/2015 0148  GLUCAP 189*    Microbiology: No results found for this or any previous visit.  Coagulation  Studies:  Recent Labs  03-01-2015 2121  LABPROT 14.5  INR 1.11    Urinalysis:  Recent Labs Lab 2015/03/01 2200  COLORURINE YELLOW  LABSPEC 1.020  PHURINE 5.5  GLUCOSEU NEGATIVE  HGBUR NEGATIVE  BILIRUBINUR NEGATIVE  KETONESUR TRACE*  PROTEINUR NEGATIVE  UROBILINOGEN 0.2  NITRITE NEGATIVE  LEUKOCYTESUR NEGATIVE    Lipid Panel:  No results found for: CHOL, TRIG, HDL, CHOLHDL, VLDL, LDLCALC  HgbA1C: No results found for: HGBA1C  Urine Drug Screen:     Component Value Date/Time   LABOPIA NONE DETECTED Mar 01, 2015 2200   COCAINSCRNUR NONE DETECTED 01-Mar-2015 2200   LABBENZ NONE DETECTED 01-Mar-2015 2200   AMPHETMU NONE DETECTED 03/01/2015 2200   THCU NONE DETECTED 2015-03-01 2200   LABBARB NONE DETECTED 2015/03/01 2200    Alcohol Level:  Recent Labs Lab March 01, 2015 2121  ETH 8*    Other results:  Imaging: Dg Chest 2 View  01-Mar-2015   CLINICAL DATA:  Question of seizure. Patient unresponsive. Initial encounter.  EXAM: CHEST  2 VIEW  COMPARISON:  None.  FINDINGS: The lungs are well-aerated and clear. There is no evidence of focal opacification, pleural effusion or pneumothorax.  The heart is borderline normal in size. No acute osseous abnormalities are seen. Degenerative change is noted about the distal right clavicle, likely reflecting remote injury, with heterotopic bone formation about the coracoclavicular ligament. Multiple chronic left-sided rib deformities are seen.  IMPRESSION: 1. No acute cardiopulmonary process seen. 2. Multiple chronic left-sided rib deformities noted.   Electronically Signed   By: Roanna Raider M.D.   On: 03/01/2015 23:12   Ct Head Wo Contrast  03/01/2015   CLINICAL DATA:  Possible seizure, shaking episode of for 5 minutes and then unresponsive. Vomiting. History of bipolar, polysubstance abuse.  EXAM: CT HEAD WITHOUT CONTRAST  TECHNIQUE: Contiguous axial images were obtained from the base of the skull through the vertex without intravenous  contrast.  COMPARISON:  None.  FINDINGS: 6.6 x 3.8 cm RIGHT inferior frontal lobe intraparenchymal hematoma with surrounding low-density vasogenic edema, effacing the sulci. Intraventricular extension, with effacement of the RIGHT frontal horn of the lateral ventricle. No hydrocephalus. 6 mm RIGHT to LEFT subfalcine herniation. Trace amount of subarachnoid hemorrhage suspected RIGHT sylvian fissure, though this is contiguous with linear densities and punctate calcification RIGHT parietal lobe suggesting AVM. Mildly dense intracranial vessels compatible with hemoconcentration.  No acute large vascular territory infarcts. Basal cisterns are patent.  Small RIGHT maxillary mucosal retention cyst without paranasal sinus air-fluid levels. The mastoid air cells are well aerated. No skull fracture. Ocular globes and orbital contents are normal. Patient is edentulous.  IMPRESSION: 6.6 x 3.8 cm hematoma, resulting in 6 mm RIGHT to LEFT subfalcine herniation.  Intraventricular extension without hydrocephalus.  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  Acute findings discussed with and reconfirmed by Dr.RANCOUR on 2015/03/01 at 11:14 pm.   Electronically Signed   By: Awilda Metro M.D.   On: 2015/03/01  23:15   Dg Chest Portable 1 View  03/09/2015   CLINICAL DATA:  Endotracheal tube placement. Altered mental status. Orogastric tube placement. Initial encounter.  EXAM: PORTABLE CHEST 1 VIEW  COMPARISON:  Chest radiograph performed earlier today at 9:58 p.m.  FINDINGS: The patient's endotracheal tube is seen ending 4-5 cm above the carina. The enteric tube is seen extending below the diaphragm.  Vascular congestion is noted, with increased interstitial markings, more prominent on the left. This may reflect mildly asymmetric interstitial edema. No definite pleural effusion or pneumothorax is seen.  The cardiomediastinal silhouette is borderline normal in size. No acute osseous  abnormalities are identified. Chronic left-sided rib deformities are again noted.  IMPRESSION: 1. Endotracheal tube seen ending 4-5 cm above the carina. 2. Enteric tube noted extending below the diaphragm. 3. Vascular congestion, with increased interstitial markings, more prominent on the left, new from the recent prior study. This may reflect mildly asymmetric interstitial edema.   Electronically Signed   By: Roanna Raider M.D.   On: 02/26/2015 23:53    Assessment: 49 y.o. male hx of bipolar disorder, polysubstance abuse presenting with AMS and possible seizure activity. CT head imaging shows large right frontal ICH with apparent sub-falcine herniation. Concern for possible underlying vascular malformation as etiology of bleed. ED discussed with neurosurgery, felt no acute intervention indicated at this time. Admitted for further workup and monitoring.   Plan: 1) Admit to ICU 2)MRI, MRA of the brain without contrast 3) no antiplatelets or anticoagulants 4) blood pressure control with goal systolic <160 5) Frequent neuro checks 6) If symptoms worsen or there is decreased mental status, repeat stat head CT 7) PT,OT,ST 8) Keppra  BID 9) Started on 3% saline  10) Appreciate CCM support for vent management    This patient is critically ill and at significant risk of neurological worsening, death and care requires constant monitoring of vital signs, hemodynamics,respiratory and cardiac monitoring,review of multiple databases, neurological assessment, discussion with family, other specialists and medical decision making of high complexity. I spent 75 minutes of neurocritical care time in the care of this patient.     Elspeth Cho, DO [facility] [phone]  If 7pm- 7am, please page neurology on call as listed in AMION. Mar 06, 2015, 2:21 AM

## 2015-02-24 NOTE — Progress Notes (Signed)
Patient ID: Dennis Anthony, male   DOB: 1966-01-30, 49 y.o.   MRN: [id number] i will talk with dr Conchita Paris about mr Dennis Anthony

## 2015-02-24 NOTE — Progress Notes (Signed)
Was told in report that patient received paralytic in OR and should be out of system around 2000-2100. At 2200 assessment, pt still unresponsive and not moving any extremities to pain. Pupils 2equal brisk. VS stable. Dr Wynetta Emery, Neurosurgery, notified. No new orders given. Pt does have scheduled head CT for 0200am. Will continue to monitor.

## 2015-02-24 NOTE — Progress Notes (Addendum)
Name: Dennis Anthony MRN: [id number] DOB: 11/11/1965    ADMISSION DATE:  03/23/2015 CONSULTATION DATE:  03/08/2015  REFERRING MD :  Dr Hosie Poisson  CHIEF COMPLAINT:  Acute resp failure  REASON FOR CONSULTATION: vent management  HISTORY OF PRESENT ILLNESS:  Patient is a 49 year old Caucasian male with past medical history significant for psychiatric issues including bipolar disorder schizoaffective disorder and polysubstance abuse who, per report, presents to emergency department at [facility] with complaints of a brief episode of loss of consciousness and shaking. There is question on whether or not this is seizure-like activity noted by EMS. CT of the head was performed which noted a 6.6 x 3.8 cm right inferior frontal lobe intraparenchymal hematoma with surrounding low-density vasogenic edema. Also noted to have a 6 mm right-to-left subfalcine herniation with a trace amount of subarachnoid hemorrhage. The patient was intubated for airway protection and transferred to our neuro ICU.   PAST MEDICAL HISTORY :   has a past medical history of Bipolar disorder (HCC); Schizoaffective disorder (HCC); and Polysubstance dependence (HCC).  has no past surgical history on file. Prior to Admission medications   Medication Sig Start Date End Date Taking? Authorizing Provider  clonazePAM (KLONOPIN) 0.5 MG tablet Take 0.5 mg by mouth 2 (two) times daily as needed for anxiety.   Yes Historical Provider, MD  cloZAPine (CLOZARIL) 100 MG tablet Take 500 mg by mouth at bedtime.   Yes Historical Provider, MD  docusate sodium (COLACE) 100 MG capsule Take 100 mg by mouth daily.   Yes Historical Provider, MD  ibuprofen (ADVIL,MOTRIN) 200 MG tablet Take 400 mg by mouth every 4 (four) hours as needed for mild pain.   Yes Historical Provider, MD  omeprazole (PRILOSEC) 20 MG capsule Take 20 mg by mouth daily.   Yes Historical Provider, MD  risperiDONE (RISPERDAL) 1 MG tablet Take 1 mg by mouth 2 (two) times daily.   Yes  Historical Provider, MD   No Known Allergies  FAMILY HISTORY:  family history is not on file. SOCIAL HISTORY:    REVIEW OF SYSTEMS:   Unable to obtain secondary to patient being intubated and sedated.  SUBJECTIVE:  Pt is due to go for cerebral angiogram   VITAL SIGNS: Temp:  [98.1 F (36.7 C)-102.1 F (38.9 C)] 102.1 F (38.9 C) (10/03 0749) Pulse Rate:  [83-112] 101 (10/03 1000) Resp:  [16-37] 17 (10/03 1000) BP: (93-156)/(72-109) 133/82 mmHg (10/03 1000) SpO2:  [93 %-100 %] 100 % (10/03 1000) FiO2 (%):  [40 %] 40 % (10/03 1000) Weight:  [180 lb (81.647 kg)-182 lb 1.6 oz (82.6 kg)] 182 lb 1.6 oz (82.6 kg) (10/03 0137)  PHYSICAL EXAMINATION: General: Sedated, intubated Neuro:  Unresponsive HEENT:  Moist mucus membranes Cardiovascular:  S1, S2, RRR, no MRG Lungs:  Clear, No wheeze or crackles. Abdomen:  + BS, soft, non tender, non distended. Skin:  Intact. Multiple tattos   Recent Labs Lab 03/15/2015 2121 03/24/2015 0316 03/03/2015 0749  NA 135 136 135  K 3.4*  --   --   CL 106  --   --   CO2 20*  --   --   BUN 10  --   --   CREATININE 0.93  --   --   GLUCOSE 192*  --   --     Recent Labs Lab 02/27/2015 2121  HGB 16.5  HCT 47.7  WBC 17.4*  PLT 212     ASSESSMENT / PLAN: Acute respiratory failure secondary to encephalopathy caused  by the intraparenchymal hematoma with vasogenic edema..  Continue current vent support. Recheck ABG Follow daily CXR. Advance ET tube buy 1 cm. Abd x ray to check PANDA placement. Management of ICH as per neurology and neurosurgery.  Additional critical Care time 30 minutes.   Chilton Greathouse MD [facility] Pager [phone] If no answer or after 3pm call: [phone] 03/19/2015, 11:06 AM

## 2015-02-24 NOTE — Progress Notes (Signed)
Patient ID: Dennis Anthony, male   DOB: 09/18/1965,patient seen, note dictated

## 2015-02-24 NOTE — Anesthesia Preprocedure Evaluation (Signed)
Anesthesia Evaluation  Patient identified by MRN, date of birth, ID band Patient unresponsive    Reviewed: Allergy & Precautions, NPO status , Patient's Chart, lab work & pertinent test results  Airway Mallampati: Intubated  TM Distance: >3 FB Neck ROM: Full    Dental no notable dental hx.    Pulmonary neg pulmonary ROS,    Pulmonary exam normal breath sounds clear to auscultation       Cardiovascular hypertension,  Rhythm:Regular Rate:Tachycardia     Neuro/Psych Bipolar Disorder Schizophrenia negative neurological ROS     GI/Hepatic negative GI ROS, (+)     substance abuse  cocaine use and IV drug use,   Endo/Other  negative endocrine ROS  Renal/GU negative Renal ROS  negative genitourinary   Musculoskeletal negative musculoskeletal ROS (+)   Abdominal   Peds negative pediatric ROS (+)  Hematology negative hematology ROS (+)   Anesthesia Other Findings   Reproductive/Obstetrics negative OB ROS                             Anesthesia Physical Anesthesia Plan  ASA: IV  Anesthesia Plan: General   Post-op Pain Management:    Induction: Inhalational  Airway Management Planned: Oral ETT  Additional Equipment: Arterial line  Intra-op Plan:   Post-operative Plan: Post-operative intubation/ventilation  Informed Consent: I have reviewed the patients History and Physical, chart, labs and discussed the procedure including the risks, benefits and alternatives for the proposed anesthesia with the patient or authorized representative who has indicated his/her understanding and acceptance.   History available from chart only  Plan Discussed with: CRNA and Surgeon  Anesthesia Plan Comments:         Anesthesia Quick Evaluation

## 2015-02-24 NOTE — Procedures (Signed)
Central Venous Catheter Insertion Procedure Note Dennis Anthony [id number] [birth year]  Procedure: Insertion of Central Venous Catheter Indications: Assessment of intravascular volume, Drug and/or fluid administration and Frequent blood sampling  Procedure Details Consent: Risks of procedure as well as the alternatives and risks of each were explained to the (patient/caregiver).  Consent for procedure obtained. Time Out: Verified patient identification, verified procedure, site/side was marked, verified correct patient position, special equipment/implants available, medications/allergies/relevent history reviewed, required imaging and test results available.  Performed  Maximum sterile technique was used including antiseptics, cap, gloves, gown, hand hygiene, mask and sheet. Skin prep: Chlorhexidine; local anesthetic administered A antimicrobial bonded/coated triple lumen catheter was placed in the left internal jugular vein using the Seldinger technique.  Evaluation Blood flow good Complications: No apparent complications Patient did tolerate procedure well. Chest X-ray ordered to verify placement.  CXR: pending.  Dennis Anthony Dennis Anthony 03/02/2015, 3:00 AM

## 2015-02-24 NOTE — Op Note (Signed)
PREOP DIAGNOSIS:  1. Anterior communicating artery aneurysm 2. Right frontal hematoma  POSTOP DIAGNOSIS: Same  PROCEDURE: 1. Right frontotemporal craniotomy for clipping of anterior communicating artery aneurysm 2. Evacuation of right frontal hematoma 3. Use of operating microscope for microdissection 4. Use of intraoperative ICG videoangiography   SURGEON: Dr. Tyvion Edmondson, MD  ASSISTANT: Dr. Henry Elsner, MD  ANESTHESIA: General Endotracheal   EBL: 400cc  SPECIMENS: No isne  DRAINS: None  COMPLICATIONS: None immediate  CONDITION: Hemodynamically stable to ICU  HISTORY: Dennis Anthony is a 49 y.o. male initially presented to the hospital with altered mental status. CT scan demonstrated intraparenchymal hematoma, and diagnostic angiogram revealed an Acom aneurysm not easily amenable to coil embolization. He therefore presents for surgical clip ligation.  PROCEDURE IN DETAIL: After informed consent was obtained and witnessed, the patient was brought to the operating room. After induction of general anesthesia, the patient was positioned on the operative table in the supine position. All pressure points were meticulously padded. The mayfiled was applied to the patient and affixed to the table. Skin incision was then marked out and prepped and draped in the usual sterile fashion.  After time-out was conducted, skin incision was made sharply and Bovie electrocautery was used to dissect the subcutaneous tissue and galea. Raney clips were then used to secure hemostasis on the skin edges. The superficial temporal artery was dissected free and retracted with the skin flap. Bovie electrocautery was used to dissect through the pericranium. The temporal fascia was incised and the skin flap was elevated with the temporal fat pad. The temporalis muscle was then incised and reflected inferiorly.  Fishhooks were then used for retraction. Bur holes were then created in the pterion, above the root  of the zygoma, and the superior temporal line. These are then connected with the craniotome and a standard pterional craniotomy flap was elevated. Hemostasis was achieved on the bone edges, and a high-speed drill was used to drill down the lesser wing of the sphenoid.  The dura was then opened in cruciate fashion and good hemostasis was achieved on the dural edges. At this point the microscope was draped and brought into the field and the remainder of the case was done under the microscope using microdissection.  Bipolar electrocautery was used to coagulate the pia over the inferior frontal gyrus, and using a combination of bipolar electrocautery and suction, we quickly identified the large frontal intraparenchymal hematoma and this was partially removed for good brain relaxation.  The opticocarotid cistern was opened and dissection was carried laterally until the internal carotid artery was identified. Our attention was then turned to dissection of the internal carotid artery to the level of the ICA bifurcation. The right A1 was then dissected free and traced to the interhemispheric fissure. I then dissected across the planum to identify the left optic nerve and the left A1. At this point, I coagulated the pia over the right gyrus rectus and resected a portion of it. The aneurysm dome was identified. Again using bipolar electrocautery, I carefully dissected teh aneurysm dome away from bilateral frontal lobes. Once the aneurysm was partially mobilized, the right and left A2s were identified. The left Huebner was identified, and unfortunately the right Huebner was ligated during dissection of the aneurysm dome. At this point,43m a[facility]56mal[id number] 40mGaWilkes68m RMaimonides Medical Centergil clip was placed across the neck of the aneurysm. Subsequent inspection and ICG angiography demonstrated patency of both A1s, A2s, and the left Huebner.  At this point the wound is  irrigated with copious amounts of normal saline irrigation. Good hemostasis  was confirmed on the brain surface. The dura was then closed using a combination of interrupted 4-0 Nurolon stitches. A piece of DuraMatrix was then placed over the dural surface. Muscle was then closed using interrupted 0 Vicryl stitches, and the galea was closed using interrupted 3-0 Vicryl sutures. The skin was closed using standard surgical skin staples. Sterile dressing was then applied after the [place] head holder was removed. The patient was then transferred to the stretcher and taken to the ICU in stable hemodynamic condition.  At the end of the case all sponge, needle, cottonoid, and instrument counts were correct.

## 2015-02-24 NOTE — Progress Notes (Signed)
Patient ID: Dennis Anthony, male   DOB: 1965-11-04, 49 y.o.   MRN: [id number] Dr Conchita Paris to proceed with a cerebral angiogram .

## 2015-02-24 NOTE — Progress Notes (Addendum)
Initial Nutrition Assessment   INTERVENTION:  Recommend initiating TF within 24 hours: Initiate Vital AF 1.2 @ 20 ml/hr via OGT and increase by 10 ml every 4 hours to goal rate of 75 ml/hr.   Tube feeding regimen provides 2160 kcal (97% of needs), 135 grams of protein, and 1458 ml of H2O.    NUTRITION DIAGNOSIS:   Inadequate oral intake related to inability to eat as evidenced by NPO status.   GOAL:   Patient will meet greater than or equal to 90% of their needs   MONITOR:   Vent status, TF tolerance, Weight trends, Labs, I & O's  REASON FOR ASSESSMENT:   Ventilator    ASSESSMENT:   49 year old Caucasian male with past medical history significant for psychiatric issues including bipolar disorder schizoaffective disorder and polysubstance abuse who, per report, presents to ED at [facility] with complaints of a brief episode of loss of consciousness and shaking. CT of the head was performed which noted a 6.6 x 3.8 cm right inferior frontal lobe intraparenchymal hematoma with surrounding low-density vasogenic edema. Also noted to have a 6 mm right-to-left subfalcine herniation with a trace amount of subarachnoid hemorrhage. The patient was intubated for airway protection and transferred to our neuro ICU.   Pt out of room to Neuro ORS at time of visit.   Patient is currently intubated on ventilator support MV: 10.2 L/min Temp (24hrs), Avg:99.5 F (37.5 C), Min:98.1 F (36.7 C), Max:102.1 F (38.9 C)  Labs reviewed. Low calcium  Diet Order:  Diet NPO time specified  Skin:  Reviewed, no issues  Last BM:  PTA  Height:   Ht Readings from Last 1 Encounters:  03/24/2015  (1.753 m)    Weight:   Wt Readings from Last 1 Encounters:  03/07/2015 182 lb 1.6 oz (82.6 kg)    Ideal Body Weight:  72.7 kg  BMI:  Body mass index is 26.9 kg/(m^2)  Estimated Nutritional Needs:   Kcal:  2218  Protein:  115-130 grams  Fluid:  2.4 L/day  EDUCATION NEEDS:   No  education needs identified at this time  Dorothea Ogle RD, LDN Inpatient Clinical Dietitian Pager: [phone] After Hours Pager: [phone]

## 2015-02-24 NOTE — Progress Notes (Signed)
RT and RN transported patient from 3M09 to MRI, then from MRI to CT, and then from CT back to 3M09 with no complications.

## 2015-02-24 NOTE — Consult Note (Signed)
NAMEMarland Kitchen  KAIDENCE, CALLAWAY NO.:  [id number]  MEDICAL RECORD NO.:  [id number]  LOCATION:  3M09C                        FACILITY:  [facility]  PHYSICIAN:  Hilda Lias, M.D.   DATE OF BIRTH:  01-31-1966  DATE OF CONSULTATION:  02/22/2015 DATE OF DISCHARGE:                                CONSULTATION   TIME:  3:35 a.m.  HISTORY OF PRESENT ILLNESS:  Mr. Eberlein is a gentleman, who was taken to [facility] Emergency room last night after he was found with one episode of questionable seizure without decreased level of conscious.  The patient vomited twice on his way to the emergency room. When I talked to Dr. Blinda Leatherwood, he told me that the patient was awake, following commands, and that they were going to intubate him after the CT scan was done.  The reason for intubation was because they want to preserve his airway, transferring to [facility].  I reviewed the CT scan.  The patient eventually was transferred to [facility], intubated. I saw him about 3:30 a.m.  He had a central line.  Clinically, there is no evidence of any trauma in the head.  The pupils are 2 mm.  He is on heavy sedation.  He is missing the right leg, secondary to AK amputation.  The CT scan showed that he indeed has hematoma in the right frontal area, which is approximately 6.5 x 4 cm with vasogenic edema. There was some blood going to the ventricle and there was no evidence of any hydrocephalus.  There is a subfalcine herniation to the left.  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  In the next few minutes, I am going to hold the _iv sedation_________ to see how he is clinically.  The plan here is to repeat the CT scan early this morning and to compare with the previous one.  The case is going to be approached.  If clinically and by CT scan there is no deterioration, we will treat hematoma conservatively to wait for resolution.  The CT scan will be a  CT angiogram.  If the CT showed the hematoma is large, then we will be able to proceed with surgery.  I do believe that the bleeding is secondary to AVM and I do not know if there is a connection between what we see in the frontal area and the calcification on the parietal.  Nevertheless, the situation is going to be monitored closely.  There is no family member around.  He is already on Keppra.i will let dr Conchita Paris know about the case to get his input          ______________________________ Hilda Lias, M.D.     EB/MEDQ  D:  03/10/2015  T:  03/09/2015  Job:  [id number]

## 2015-02-24 NOTE — Progress Notes (Addendum)
STROKE TEAM PROGRESS NOTE  Dennis Anthony is an 49 y.o. male hx of bipolar disorder, polysubstance abuse transferred from [facility] ED after presenting with AMS and questionable seizure episode. BP upon arrival was 156/110. CT head performed at that time (imaging reviewed) which showed a 6.6 x 3.8 cm right inferior frontal lobe ICH with 6mm right-to-left subfalcine herniation. Patient was intubated in the ED for airway protection. Prior to intubation, ED attending noted patient was minimally following commands and moving right side. ED attending (Dr Blinda Leatherwood) discussed case and reviewed images with neurosurgery (Dr Othelia Pulling) who did not feel that the patient required immediate surgical intervention.   Date last known well: 03/03/2015 Time last known well: unclear tPA Given: no, ICH Modified Rankin: Rankin Score=0  ICH Score: 3 SUBJECTIVE (INTERVAL HISTORY) Patient had repeat CT scan showing slight increase in subfalcine herniation. And hematoma size. Blood pressure has been adequately controlled. No more seizures witnessed. No family available at the bedside. Hypertonic saline has been started.  OBJECTIVE Temp:  [98.1 F (36.7 C)-102.1 F (38.9 C)] 102.1 F (38.9 C) (10/03 0749) Pulse Rate:  [83-112] 100 (10/03 0800) Cardiac Rhythm:  [-] Normal sinus rhythm;Sinus tachycardia (10/03 0800) Resp:  [16-37] 22 (10/03 0800) BP: (93-156)/(72-109) 123/84 mmHg (10/03 0800) SpO2:  [93 %-100 %] 100 % (10/03 0800) FiO2 (%):  [40 %] 40 % (10/03 0800) Weight:  [81.647 kg (180 lb)-82.6 kg (182 lb 1.6 oz)] 82.6 kg (182 lb 1.6 oz) (10/03 0137)  CBC:  Recent Labs Lab 03/19/2015 2121  WBC 17.4*  NEUTROABS 11.6*  HGB 16.5  HCT 47.7  MCV 88.7  PLT 212    Basic Metabolic Panel:  Recent Labs Lab 03/13/2015 2121 03/20/2015 0316  NA 135 136  K 3.4*  --   CL 106  --   CO2 20*  --   GLUCOSE 192*  --   BUN 10  --   CREATININE 0.93  --   CALCIUM 8.4*  --     Lipid Panel:    Component Value Date/Time    TRIG 165* 03/14/2015 0151   HgbA1c: No results found for: HGBA1C Urine Drug Screen:    Component Value Date/Time   LABOPIA NONE DETECTED 03/09/2015 2200   COCAINSCRNUR NONE DETECTED 03/06/2015 2200   LABBENZ NONE DETECTED 03/11/2015 2200   AMPHETMU NONE DETECTED 03/19/2015 2200   THCU NONE DETECTED 02/22/2015 2200   LABBARB NONE DETECTED 02/28/2015 2200      IMAGING  Ct Angio Head W/cm &/or Wo Cm 03/13/2015   Right anterior frontal lobe hematoma has increased slightly in size now 7.2 x 4.4 cm versus prior 7 x 4.4 cm.  Midline shift to the left by 1.3 cm versus prior 1.2 cm.  Moderate to large amount of intraventricular blood.  Trapping of the temporal horns greater on the left.  More evident is subarachnoid blood and/ or pseudo subarachnoid appearance from diffuse edema.  Calcification right parietal lobe consistent with underlying arteriovenous malformation.  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  Enlarged right middle cerebral artery branch supplying right parietal lobe larger arteriovenous malformation. Draining veins towards the superior sagittal sinus.  Mild calcification right internal carotid artery cavernous segment without high-grade stenosis as questioned on MR.  Proximal basilar artery fenestration without saccular aneurysm.     Dg Chest 2 View 02/25/2015   1. No acute cardiopulmonary process seen. 2. Multiple chronic left-sided rib deformities noted.     Ct Head Wo Contrast  03/13/2015   Right anterior frontal lobe hematoma has increased slightly in size now 7.2 x 4.4 cm versus prior 7 x 4.4 cm.  Midline shift to the left by 1.3 cm versus prior 1.2 cm.  Moderate to large amount of intraventricular blood.  Trapping of the temporal horns greater on the left.  More evident is subarachnoid blood and/ or pseudo subarachnoid appearance from diffuse edema.  Calcification right parietal lobe consistent with underlying arteriovenous malformation.  Complex  bilobed anterior communicating artery 11.4 x 8 x 6.4 mm aneurysm.  Enlarged right middle cerebral artery branch supplying right parietal lobe larger arteriovenous malformation. Draining veins towards the superior sagittal sinus.  Mild calcification right internal carotid artery cavernous segment without high-grade stenosis as questioned on MR.  Proximal basilar artery fenestration without saccular aneurysm   03/03/2015   6.6 x 3.8 cm hematoma, resulting in 6 mm RIGHT to LEFT subfalcine herniation.  Intraventricular extension without hydrocephalus.  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    MRI HEAD 03/16/2015   Enlarging 7 x 4.4 cm RIGHT inferior frontal lobe intraparenchymal hematoma, worsening mass-effect with 12 mm RIGHT to LEFT subfalcine herniation.  Intraventricular extension of hemorrhage, new mild hydrocephalus.    MRA HEAD 03/24/2015   11 x 7 mm LEFT A1-2 junction aneurysm, cause of RIGHT frontal lobe hemorrhage.  Partially characterized RIGHT parietal AVM, and enlarged RIGHT middle cerebral feeding arteries. Large draining vein to the superior sagittal sinus seen on MRI.     Dg Chest Port 1 View 03/15/2015   1. Left IJ line noted ending about the distal SVC. 2. Mild left basilar airspace opacity likely reflects atelectasis. Lungs otherwise grossly clear.    02/22/2015    1. Endotracheal tube seen ending 4-5 cm above the carina. 2. Enteric tube noted extending below the diaphragm. 3. Vascular congestion, with increased interstitial markings, more prominent on the left, new from the recent prior study. This may reflect mildly asymmetric interstitial edema.     EEG   PHYSICAL EXAM Obese middle-age Caucasian male who is intubated and sedated. . Afebrile. Head is nontraumatic. Neck is supple without bruit.    Cardiac exam no murmur or gallop. Lungs are clear to auscultation. 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 Neurological Exam : Patient is comatose and unresponsive. Eyes are closed. Right pupil 3 mm left to mimic the both sluggishly reactive. Doll's eye movements are present with slight right gaze preference. Does not blink to threat. Fundi were not visualized. No facial grimacing to painful stimuli. Patient response to sternal rub with semipurposeful flexion on the right upper greater than lower extremity and slight extensor posturing in the left upper extremity and no response in left lower extremity. Deep tendon reflexes are 2+ symmetric plantars downgoing. ASSESSMENT/PLAN Mr. Dennis Anthony is a 49 y.o. male with history of bipolar disorder, polysubstance abuse presenting with possible seizure. CT in ED with R frontal ICH with [id number] Medic[facility] Marland KitchenM57mar(727[place] CountyFred[facility] Mar[place] KitchenM12mar336[facility]alFred[facility] Marland KitchenM4mar3[facility] MedicFredric Marland KitchenM94mar(940[facility]Fredric Marland KitchenM35mar9[facility]ric Marland KitchenM35mar3[facility] W[facility] Marland KitchenM28mar(920[facility]Fredric Marland KitchenM18mar540[facility] MedicFredric Marland KitchenM76mFredric Marland KitchenM40mar670[facility] Fredric Marland KitchenM9mar(210)[facility] CF[facility]ric Marland KitchenM24mar647[facility] WomenFredric Marland KitchenMarenjiman Coreft herniation. Intubated in the ED.   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 Patient also has right parietal calcified AVM which does not appear to have bled and patient's aneurysms may be flow related  Resultant  comatose state with left hemiplegia  MRI  Enlarging R inferior frontal lobe IPH, with worsening mass effect w/ 12 mm R to L subfalcine herniation  MRA  L  A1-2 R junction aneurysm,  RIGHT parietal AVM with enlarged RIGHT middle cerebral feeding arteries.  EEG pending   Transcranial Doppler  Ordered M, W, F  SCDs for VTE prophylaxis  Diet NPO time specified  no antithrombotic prior to admission  Therapy recommendations:  pending   Disposition:  pending   Cytotoxic Cerebral Edema  Started on 3%  Acute Respiratory Failure  Intubated in ED  Hypertension  Stable  Other Active Problems  bipoloar disorder/schizoaffective disorder  Hospital day # 1 I have personally examined this patient, reviewed notes, independently viewed imaging studies, participated in medical decision making and plan of care. I have made any additions or  clarifications directly to the above note. Patient has presented with a seizure and unresponsiveness secondary to large right frontal mostly parenchymal hemorrhage secondary to complex bilobed anterior communicating artery aneurysm which is likely flow related due to her right parietal calcified AV malformation. Recommend strict control of blood pressure with goal systolic blood pressure below 829. Strict control of temperature with normothermia and glucose is euglycemia. Check transcranial Doppler studies. Continue hypertonic saline to decrease cytotoxic edema with sodium goal 150-155. Give bolus dose of 23.4% hypertonic saline to get sodium up quickly. Discussed with Dr. Alease Frame who plans to do catheter angiogram followed by endovascular coiling of the aneurysms and agrees to transfer the patient to neurosurgery service. Patient is at significant risk for neurological worsening with brain herniation, hydrocephalus and dying.This patient is critically ill and at significant risk of neurological worsening, death and care requires constant monitoring of vital signs, hemodynamics,respiratory and cardiac monitoring, extensive review of multiple databases, frequent neurological assessment, discussion with family, other specialists and medical decision making of high complexity.I have made any additions or clarifications directly to the above note.This critical care time does not reflect procedure time, or teaching time or supervisory time of PA/NP/Med Resident etc but could involve care discussion time.  I spent 40 minutes of neurocritical care time  in the care of  this patient.   Delia Heady, MD   Delia Heady, MD Medical Director [facility] Stroke Center Pager: [phone] 03/24/2015 2:13 PM      To contact Stroke Continuity provider, please refer to [email or web address]. After hours, contact General Neurology

## 2015-02-24 NOTE — Progress Notes (Signed)
Pt seen and examined. No issues overnight.   History reviewed. Pt initially presented with possible SZ. He was intubated for airway protection, and CTdemontstrated large right frontal hematoma. Of note, he does have a history of polysubstance abuse, medical hx of HTN, and unknown smoking history. There is no known FHx of aneurysms/AVM.  EXAM: Temp:  [98.1 F (36.7 C)-102.1 F (38.9 C)] 100.2 F (37.9 C) (10/03 1147) Pulse Rate:  [83-112] 102 (10/03 1200) Resp:  [16-37] 18 (10/03 1200) BP: (93-156)/(72-109) 134/90 mmHg (10/03 1200) SpO2:  [93 %-100 %] 100 % (10/03 1200) Arterial Line BP: (162)/(80) 162/80 mmHg (10/03 1200) FiO2 (%):  [40 %] 40 % (10/03 1200) Weight:  [81.647 kg (180 lb)-82.6 kg (182 lb 1.6 oz)] 82.6 kg (182 lb 1.6 oz) (10/03 0137) Intake/Output      10/02 0701 - 10/03 0700 10/03 0701 - 10/04 0700   I.V. (mL/kg) 237.6 (2.9) 305 (3.7)   IV Piggyback  135   Total Intake(mL/kg) 237.6 (2.9) 440 (5.3)   Urine (mL/kg/hr) 550 500 (1.2)   Total Output 550 500   Net -312.4 -60         Off propofol: Pupils 3mm, reactive Rightward gaze deviation (+) cough/gag Breathes over vent W/D BUE to noxious pain, R>L  LABS: Lab Results  Component Value Date   CREATININE 0.93 03/08/2015   BUN 10 03/02/2015   NA 135 02/22/2015   K 3.4* 03/23/2015   CL 106 02/27/2015   CO2 20* 03/08/2015   Lab Results  Component Value Date   WBC 17.4* 02/22/2015   HGB 16.5 03/16/2015   HCT 47.7 03/22/2015   MCV 88.7 03/16/2015   PLT 212 03/15/2015    IMAGING: CTH demonstrates large right frontal IPH with R-->L MLS. Minimal to no SAH is seen. No significant ventriculomegaly.   CTA/MRI/MRA demonstrates ~55mm A1-A2 aneurysm, and ~3.2x2.5x2.0 cm right parietal AVM with calcification, fed primarily by large right MCA branches.  IMPRESSION: - 49 y.o. 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  PLAN: - Will proceed with diagnostic angiogram, treatment of ruptured aneurysm - Cont hypertonic saline for edema - If patient recovers from aneurysm rupture, may consider resection of AVM at a later date.  I spoke at length with the patient's brother regarding imaging findings thus far. I explained to them that his hemorrhage is likely the result of the aneurysm which is likely secondary to the high flow because of the AVM. I also explained to them the possible treatment options for intracranial aneurysms including endovascular coiling and open clip ligation. The risks of the angiogram, coiling, and surgical clipping were also reviewed to include stroke and aneurysm re-rupture leading to weakness/paralysis/coma/death, infection, SZ, hydrocephalus.   The patient's brother understood our discussion and the provided consent to proceed with diagnostic angiogram and the appropriate treatment for the aneurysm.

## 2015-02-24 NOTE — Progress Notes (Signed)
Patient ID: Dennis Anthony, male   DOB: 1965/10/22, 49 y.o.   MRN: [id number] Off sedation he withdraws all 4 extremities, pupils 2 mms. Vs wnl. For ct head  By 8 am.

## 2015-02-24 NOTE — Op Note (Signed)
DIAGNOSTIC CEREBRAL ANGIOGRAM    OPERATOR:   Dr. Lisbeth Renshaw, MD  HISTORY:   The patient is a 49 y.o. yo male initially presented to the hospital with seizure, and decreased mental status. CT scan was done which demonstrated a very large right frontal hematoma. The patient was intubated in the emergency department for airway protection, and transferred to [facility]. Subsequent CT angiogram as well as MRI and MRA were completed which demonstrated a proximally 11 mm a, aneurysm, as well as a right parietal arteriovenous malformation.He therefore now presents for diagnostic cerebral angiogram.  APPROACH:   The technical aspects of the procedure as well as its potential risks and benefits were reviewed with the patient. These risks included but were not limited bleeding, infection, allergic reaction, damage to organs/vital structures, stroke, non-diagnostic procedure, and the catastrophic outcomes of heart attack, coma, and death. With an understanding of these risks, informed consent was obtained and witnessed.    The patient was placed in the supine position on the angiography table and the skin of right groin prepped in the usual sterile fashion. The procedure was performed undergeneral anesthesia.   A 5- French sheath was introduced in the right common femoral artery using Seldinger technique.  A fluorophase sequence was used to document the sheath position.    HEPARIN: 0 Units total.   CONTRAST AGENT: 110cc, Omnipaque 300   FLUOROSCOPY TIME: 4.5 combined AP and lateral minutes    CATHETER(S) AND WIRE(S):    5-French JB-1 glidecatheter   0.035" glidewire    VESSELS CATHETERIZED:   Right internal carotid   Left internal carotid   Right vertebral   Left vertebral   Right common femoral  VESSELS STUDIED:   Right internal carotid, head Right vertebral Left internal carotid, head Left vertebral Right femoral  PROCEDURAL NARRATIVE:   A 5-Fr JB-1 terumo glide catheter was  advanced over a 0.035 glidewire into the aortic arch. The above vessels were then sequentially catheterized and cervical/cerebral angiograms taken. After review of images, the catheter was removed without incident.    INTERPRETATION:   Right internal carotid: head:   Injection reveals the presence of a widely patent ICA, M1, and A1 segments and their branches.   T the anterior maintaining artery aneurysm seen on CT angiogram was not visualized from this right-sided injection. There is high flow through hypertrophied branches of the middle cerebral and anterior cerebral arteries supplying an approximately 3.1 x 2.6 x 1.6cm AVM nidus. Venous drainage is through primarily large veins directly into the superior sagittal sinus, as well as a inferiorly directed superficial cortical vein which ultimately empties into the region of the transverse sigmoid junction. No significant venous varus ease or ectasia seen. No feeding vessel or intra-nidal aneurysms are seen. The parenchymal and venous phases are normal. The venous sinuses are widely patent.    Left internal carotid: head:   Injection reveals the presence of a widely patent ICA, A1, and M1 segments and their branches. there is a patent anterior communicating artery, with supply to the medial aspect of the above-described arteriovenous malformation from the distal right ACA. Also seen is a bilobed anterior communicating artery aneurysm measuring approximately 10 millimeters in maximal dimension. This appears to projecting anteriorly.  The parenchymal and venous phases are normal. The venous sinuses are widely patent.    Left internal carotid, 3-D rotation: 3-dimensional rotational angiographic images were reconstructed on independent workstation. These further delineate the above described a, aneurysm. The aneurysm is bilobed, and arises  directly from the anterior to making artery complex. The neck of the aneurysm measures approximately 3.9 mm.  Left  vertebral:   Injection reveals the presence of a widely patent vertebral artery. This leads to a widely patent basilar artery that terminates in bilateral P1. The basilar apex is normal. There is no significant stenosis, occlusion, aneurysm, or vascular malformation visualized.  of note, there is no supply to the above-described right parietal arteriovenous malformation. The parenchymal and venous phases are normal. The venous sinuses are widely patent.    Right vertebral:    Normal vessel. No PICA aneurysm. See basilar description above.    Right femoral:    Normal vessel. No significant atherosclerotic disease. Arterial sheath in adequate position.   DISPOSITION:  Upon completion of the study, the femoral sheath was removed and hemostasis obtained using a 5-Fr ExoSeal closure device. Good proximal and distal lower extremity pulses were documented upon achievement of hemostasis.    The procedure was well tolerated and no early complications were observed.       The patient was transfto the operating room for treatment of the aneurysm.  IMPRESSION:  1. 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 The fact that this aneurysm does not fill from the right-sided injection suggests that this is in fact coincidental rather than AVM flow related.  2. 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

## 2015-02-24 NOTE — Progress Notes (Signed)
Radiologist called about MRI results. Dr Jeral Fruit & Conchita Paris paged. Spoke with Dr Conchita Paris regarding MRI results.

## 2015-02-24 NOTE — Consult Note (Signed)
Name: Coleman Kalas MRN: [id number] DOB: 06-17-1965    ADMISSION DATE:  2015-03-21 CONSULTATION DATE:  03/07/2015  REFERRING MD :  Dr Hosie Poisson  CHIEF COMPLAINT:  Acute resp failure  REASON FOR CONSULTATION: vent management  HISTORY OF PRESENT ILLNESS:  Patient is a 49 year old Caucasian male with past medical history significant for psychiatric issues including bipolar disorder schizoaffective disorder and polysubstance abuse who, per report, presents to emergency department at [facility] with complaints of a brief episode of loss of consciousness and shaking. There is question on whether or not this is seizure-like activity noted by EMS. CT of the head was performed which noted a 6.6 x 3.8 cm right inferior frontal lobe intraparenchymal hematoma with surrounding low-density vasogenic edema. Also noted to have a 6 mm right-to-left subfalcine herniation with a trace amount of subarachnoid hemorrhage. The patient was intubated for airway protection and transferred to our neuro ICU.   PAST MEDICAL HISTORY :   has a past medical history of Bipolar disorder (HCC); Schizoaffective disorder (HCC); and Polysubstance dependence (HCC).  has no past surgical history on file. Prior to Admission medications   Medication Sig Start Date End Date Taking? Authorizing Provider  clonazePAM (KLONOPIN) 0.5 MG tablet Take 0.5 mg by mouth 2 (two) times daily as needed for anxiety.   Yes Historical Provider, MD  cloZAPine (CLOZARIL) 100 MG tablet Take 500 mg by mouth at bedtime.   Yes Historical Provider, MD  docusate sodium (COLACE) 100 MG capsule Take 100 mg by mouth daily.   Yes Historical Provider, MD  ibuprofen (ADVIL,MOTRIN) 200 MG tablet Take 400 mg by mouth every 4 (four) hours as needed for mild pain.   Yes Historical Provider, MD  omeprazole (PRILOSEC) 20 MG capsule Take 20 mg by mouth daily.   Yes Historical Provider, MD  risperiDONE (RISPERDAL) 1 MG tablet Take 1 mg by mouth 2 (two) times daily.   Yes  Historical Provider, MD   No Known Allergies  FAMILY HISTORY:  family history is not on file. SOCIAL HISTORY:    REVIEW OF SYSTEMS:   Unable to obtain secondary to patient being intubated and currently on 25 of propofol  SUBJECTIVE:   VITAL SIGNS: Temp:  [98.1 F (36.7 C)-98.2 F (36.8 C)] 98.1 F (36.7 C) (10/03 0137) Pulse Rate:  [83-112] 112 (10/03 0145) Resp:  [22-37] 30 (10/03 0145) BP: (93-156)/(72-103) 120/103 mmHg (10/03 0145) SpO2:  [93 %-100 %] 100 % (10/03 0145) FiO2 (%):  [40 %] 40 % (10/03 0145) Weight:  [81.647 kg (180 lb)-82.6 kg (182 lb 1.6 oz)] 82.6 kg (182 lb 1.6 oz) (10/03 0137)  PHYSICAL EXAMINATION: General:  Patient is a 49 year old Caucasian male who is well-developed well-nourished but appears older than his stated age and is currently intubated and sedated on propofol Neuro:  Neuro exam was performed with patient on propofol. Patient is unresponsive to voice he does not localize to sternal rub nor noxious stimuli to any of his 4 extremities. Pupils are 2 mm bilaterally with minimal responsiveness HEENT:  Head normocephalic atraumatic eyes as noted above ears are symmetric and permeable Moses Proulx was midline septum oropharynx with endotracheal tube in place moist mucosas Cardiovascular:  S1 and S2 he is tachycardic no thrills no murmurs rubs or gallops Lungs:  Clear to auscultation bilaterally expansion no wheezing, rhonchi, rales Abdomen:  Positive bowel sounds nondistended nontender, no palpable visceromegaly Musculoskeletal:  No muscular atrophy noted and would assess range of motion, patient has a right AKA Skin:  Patient has no notable skin lesions, he has multiple tattoos including and eagle on his right arm, a dragon on his left chest, and a skull on his left arm.   Recent Labs Lab 02/22/2015 2121  NA 135  K 3.4*  CL 106  CO2 20*  BUN 10  CREATININE 0.93  GLUCOSE 192*    Recent Labs Lab 03/23/2015 2121  HGB 16.5  HCT 47.7  WBC 17.4*    PLT 212   Dg Chest 2 View  02/22/2015   CLINICAL DATA:  Question of seizure. Patient unresponsive. Initial encounter.  EXAM: CHEST  2 VIEW  COMPARISON:  None.  FINDINGS: The lungs are well-aerated and clear. There is no evidence of focal opacification, pleural effusion or pneumothorax.  The heart is borderline normal in size. No acute osseous abnormalities are seen. Degenerative change is noted about the distal right clavicle, likely reflecting remote injury, with heterotopic bone formation about the coracoclavicular ligament. Multiple chronic left-sided rib deformities are seen.  IMPRESSION: 1. No acute cardiopulmonary process seen. 2. Multiple chronic left-sided rib deformities noted.   Electronically Signed   By: Roanna Raider M.D.   On: 02/22/2015 23:12   Ct Head Wo Contrast  02/28/2015   CLINICAL DATA:  Possible seizure, shaking episode of for 5 minutes and then unresponsive. Vomiting. History of bipolar, polysubstance abuse.  EXAM: CT HEAD WITHOUT CONTRAST  TECHNIQUE: Contiguous axial images were obtained from the base of the skull through the vertex without intravenous contrast.  COMPARISON:  None.  FINDINGS: 6.6 x 3.8 cm RIGHT inferior frontal lobe intraparenchymal hematoma with surrounding low-density vasogenic edema, effacing the sulci. Intraventricular extension, with effacement of the RIGHT frontal horn of the lateral ventricle. No hydrocephalus. 6 mm RIGHT to LEFT subfalcine herniation. Trace amount of subarachnoid hemorrhage suspected RIGHT sylvian fissure, though this is contiguous with linear densities and punctate calcification RIGHT parietal lobe suggesting AVM. Mildly dense intracranial vessels compatible with hemoconcentration.  No acute large vascular territory infarcts. Basal cisterns are patent.  Small RIGHT maxillary mucosal retention cyst without paranasal sinus air-fluid levels. The mastoid air cells are well aerated. No skull fracture. Ocular globes and orbital contents are  normal. Patient is edentulous.  IMPRESSION: 6.6 x 3.8 cm hematoma, resulting in 6 mm RIGHT to LEFT subfalcine herniation.  Intraventricular extension without hydrocephalus.  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  Acute findings discussed with and reconfirmed by Dr.RANCOUR on 03/10/2015 at 11:14 pm.   Electronically Signed   By: Awilda Metro M.D.   On: 02/22/2015 23:15   Dg Chest Portable 1 View  02/28/2015   CLINICAL DATA:  Endotracheal tube placement. Altered mental status. Orogastric tube placement. Initial encounter.  EXAM: PORTABLE CHEST 1 VIEW  COMPARISON:  Chest radiograph performed earlier today at 9:58 p.m.  FINDINGS: The patient's endotracheal tube is seen ending 4-5 cm above the carina. The enteric tube is seen extending below the diaphragm.  Vascular congestion is noted, with increased interstitial markings, more prominent on the left. This may reflect mildly asymmetric interstitial edema. No definite pleural effusion or pneumothorax is seen.  The cardiomediastinal silhouette is borderline normal in size. No acute osseous abnormalities are identified. Chronic left-sided rib deformities are again noted.  IMPRESSION: 1. Endotracheal tube seen ending 4-5 cm above the carina. 2. Enteric tube noted extending below the diaphragm. 3. Vascular congestion, with increased interstitial markings, more prominent on the left, new from the recent prior study. This may  reflect mildly asymmetric interstitial edema.   Electronically Signed   By: Roanna Raider M.D.   On: 2015/03/17 23:53    ASSESSMENT / PLAN: Acute respiratory failure secondary to encephalopathy caused by the intraparenchymal hematoma with vasogenic edema. We'll monitor his oxygenation titrating the PEEP and FiO2 and monitor via ABGs the patient's ventilation as well.  I will place a central line for administration of 3% NaCl which will be billed on a separate note.   Critical Care time 32  minutes.    Deetta Perla, MD Critical Care Medicine [place] Pulmonary/Critical Care Pager: [phone]  03/18/2015, 1:56 AM

## 2015-02-25 ENCOUNTER — Inpatient Hospital Stay (HOSPITAL_COMMUNITY): Payer: Medicare HMO

## 2015-02-25 ENCOUNTER — Encounter (HOSPITAL_COMMUNITY): Payer: Self-pay | Admitting: Radiology

## 2015-02-25 DIAGNOSIS — I10 Essential (primary) hypertension: Secondary | ICD-10-CM

## 2015-02-25 DIAGNOSIS — G935 Compression of brain: Secondary | ICD-10-CM

## 2015-02-25 DIAGNOSIS — J9601 Acute respiratory failure with hypoxia: Secondary | ICD-10-CM

## 2015-02-25 DIAGNOSIS — I611 Nontraumatic intracerebral hemorrhage in hemisphere, cortical: Secondary | ICD-10-CM

## 2015-02-25 DIAGNOSIS — J9600 Acute respiratory failure, unspecified whether with hypoxia or hypercapnia: Secondary | ICD-10-CM

## 2015-02-25 DIAGNOSIS — G936 Cerebral edema: Secondary | ICD-10-CM

## 2015-02-25 DIAGNOSIS — G911 Obstructive hydrocephalus: Secondary | ICD-10-CM

## 2015-02-25 DIAGNOSIS — I619 Nontraumatic intracerebral hemorrhage, unspecified: Secondary | ICD-10-CM

## 2015-02-25 DIAGNOSIS — R4020 Unspecified coma: Secondary | ICD-10-CM

## 2015-02-25 DIAGNOSIS — I609 Nontraumatic subarachnoid hemorrhage, unspecified: Secondary | ICD-10-CM

## 2015-02-25 LAB — CBC
HCT: 41.4 % (ref 39.0–52.0)
HEMOGLOBIN: 13.1 g/dL (ref 13.0–17.0)
MCH: 29 pg (ref 26.0–34.0)
MCHC: 31.6 g/dL (ref 30.0–36.0)
MCV: 91.6 fL (ref 78.0–100.0)
Platelets: 170 10*3/uL (ref 150–400)
RBC: 4.52 MIL/uL (ref 4.22–5.81)
RDW: 15.4 % (ref 11.5–15.5)
WBC: 15.4 10*3/uL — ABNORMAL HIGH (ref 4.0–10.5)

## 2015-02-25 LAB — BASIC METABOLIC PANEL
ANION GAP: 5 (ref 5–15)
BUN: 7 mg/dL (ref 6–20)
CHLORIDE: 122 mmol/L — AB (ref 101–111)
CO2: 23 mmol/L (ref 22–32)
Calcium: 8.7 mg/dL — ABNORMAL LOW (ref 8.9–10.3)
Creatinine, Ser: 0.71 mg/dL (ref 0.61–1.24)
GFR calc non Af Amer: >60 mL/min (ref >60)
Glucose, Bld: 135 mg/dL — ABNORMAL HIGH (ref 65–99)
POTASSIUM: 3.6 mmol/L (ref 3.5–5.1)
Sodium: 150 mmol/L — ABNORMAL HIGH (ref 135–145)

## 2015-02-25 LAB — BLOOD GAS, ARTERIAL
ACID-BASE DEFICIT: 4.3 mmol/L — AB (ref 0.0–2.0)
BICARBONATE: 20 meq/L (ref 20.0–24.0)
Drawn by: 39898
FIO2: 40
O2 SAT: 95.5 %
PEEP/CPAP: 5 cmH2O
PH ART: 7.377 (ref 7.350–7.450)
Patient temperature: 98.6
RATE: 14 resp/min
TCO2: 21 mmol/L (ref 0–100)
VT: 560 mL
pCO2 arterial: 34.8 mmHg — ABNORMAL LOW (ref 35.0–45.0)
pO2, Arterial: 98.8 mmHg (ref 80.0–100.0)

## 2015-02-25 LAB — POCT I-STAT 4, (NA,K, GLUC, HGB,HCT)
Glucose, Bld: 167 mg/dL — ABNORMAL HIGH (ref 65–99)
HCT: 42 % (ref 39.0–52.0)
HEMOGLOBIN: 14.3 g/dL (ref 13.0–17.0)
Potassium: 4.3 mmol/L (ref 3.5–5.1)
SODIUM: 146 mmol/L — AB (ref 135–145)

## 2015-02-25 LAB — GLUCOSE, CAPILLARY
GLUCOSE-CAPILLARY: 149 mg/dL — AB (ref 65–99)
Glucose-Capillary: 124 mg/dL — ABNORMAL HIGH (ref 65–99)
Glucose-Capillary: 156 mg/dL — ABNORMAL HIGH (ref 65–99)

## 2015-02-25 LAB — MAGNESIUM: Magnesium: 2 mg/dL (ref 1.7–2.4)

## 2015-02-25 LAB — SODIUM
SODIUM: 147 mmol/L — AB (ref 135–145)
SODIUM: 153 mmol/L — AB (ref 135–145)
SODIUM: 149 mmol/L — AB (ref 135–145)

## 2015-02-25 LAB — PHOSPHORUS: PHOSPHORUS: 2.8 mg/dL (ref 2.5–4.6)

## 2015-02-25 MED ORDER — INFLUENZA VAC SPLIT QUAD 0.5 ML IM SUSY
0.5000 mL | PREFILLED_SYRINGE | INTRAMUSCULAR | Status: AC
  Administered 2015-02-26: 0.5 mL via INTRAMUSCULAR
  Filled 2015-02-25: qty 0.5

## 2015-02-25 MED ORDER — VITAL AF 1.2 CAL PO LIQD
1000.0000 mL | ORAL | Status: DC
  Administered 2015-02-25 – 2015-02-28 (×4): 1000 mL
  Filled 2015-02-25 (×5): qty 1000

## 2015-02-25 MED ORDER — NIMODIPINE 60 MG/20ML PO SOLN
60.0000 mg | ORAL | Status: DC
  Administered 2015-02-25 – 2015-02-28 (×20): 60 mg via ORAL
  Filled 2015-02-25 (×20): qty 20

## 2015-02-25 MED ORDER — VITAL HIGH PROTEIN PO LIQD: 1000.0000 mL | ORAL | Status: DC

## 2015-02-25 MED ORDER — PNEUMOCOCCAL VAC POLYVALENT 25 MCG/0.5ML IJ INJ
0.5000 mL | INJECTION | INTRAMUSCULAR | Status: AC
  Administered 2015-02-26: 0.5 mL via INTRAMUSCULAR
  Filled 2015-02-25: qty 0.5

## 2015-02-25 MED ORDER — FENTANYL CITRATE (PF) 100 MCG/2ML IJ SOLN
50.0000 ug | INTRAMUSCULAR | Status: DC | PRN
  Administered 2015-02-25 – 2015-02-26 (×8): 100 ug via INTRAVENOUS
  Administered 2015-02-26: 50 ug via INTRAVENOUS
  Administered 2015-02-27 (×3): 100 ug via INTRAVENOUS
  Administered 2015-02-27 (×2): 50 ug via INTRAVENOUS
  Administered 2015-02-28 (×3): 100 ug via INTRAVENOUS
  Filled 2015-02-25 (×19): qty 2

## 2015-02-25 MED ORDER — NIMODIPINE 30 MG/ML ORAL SOLUTION
60.0000 mg | ORAL | Status: DC
  Filled 2015-02-25 (×6): qty 2

## 2015-02-25 MED ORDER — NIMODIPINE 30 MG PO CAPS: 60.0000 mg | ORAL_CAPSULE | ORAL | Status: DC

## 2015-02-25 NOTE — Progress Notes (Signed)
OT Cancellation Note  Patient Details Name: Dennis Anthony MRN: [id number] DOB: Jun 24, 1965   Cancelled Treatment:    Reason Eval/Treat Not Completed: Patient not medically ready. Pt intubated and sedated.  Per MD, may sign off.  Will await new orders.  Evern Bio 02/25/2015, 9:28 AM  [phone]

## 2015-02-25 NOTE — Progress Notes (Signed)
Spoke with Maren Reamer, RN at [facility]. She had the password and was updated. Burna Mortimer confirmed that Lise Auer, pt's brother, has interim guardianship for the patient and has applied for full guardianship. She was given the unit fax number to send the papers tomorrow.

## 2015-02-25 NOTE — Progress Notes (Signed)
PT Cancellation Note  Patient Details Name: Bartley Vuolo MRN: [id number] DOB: [birth year]   Cancelled Treatment:    Reason Eval/Treat Not Completed: Patient not medically ready.  Pt intubated and sedated. Per MD, may sign off. Will await new orders. 02/25/2015  Hondo Bing, PT [phone]  (pager)   Mirai Greenwood, Eliseo Gum 02/25/2015, 2:16 PM

## 2015-02-25 NOTE — Progress Notes (Signed)
Name: Dennis Anthony MRN: [id number] DOB: 1965/10/08    ADMISSION DATE:  03/06/2015 CONSULTATION DATE:  03/11/2015  REFERRING MD :  Dr Hosie Poisson  CHIEF COMPLAINT:  Acute resp failure  REASON FOR CONSULTATION: vent management  HISTORY OF PRESENT ILLNESS:  Patient is a 49 year old Caucasian male with past medical history significant for psychiatric issues including bipolar disorder schizoaffective disorder and polysubstance abuse who, per report, presents to emergency department at [facility] with complaints of a brief episode of loss of consciousness and shaking. There is question on whether or not this is seizure-like activity noted by EMS. CT of the head was performed which noted a 6.6 x 3.8 cm right inferior frontal lobe intraparenchymal hematoma with surrounding low-density vasogenic edema. Also noted to have a 6 mm right-to-left subfalcine herniation with a trace amount of subarachnoid hemorrhage. The patient was intubated for airway protection and transferred to our neuro ICU.   SUBJECTIVE:  No events overnight, unresponsive.   VITAL SIGNS: Temp:  [98.9 F (37.2 C)-100.2 F (37.9 C)] 99.1 F (37.3 C) (10/04 0800) Pulse Rate:  [66-102] 66 (10/04 0820) Resp:  [13-20] 19 (10/04 0820) BP: (111-174)/(57-90) 174/90 mmHg (10/04 0820) SpO2:  [98 %-100 %] 100 % (10/04 0820) Arterial Line BP: (98-162)/(60-81) 158/80 mmHg (10/04 0700) FiO2 (%):  [40 %] 40 % (10/04 0820)  PHYSICAL EXAMINATION: General: Sedated, intubated Neuro:  Unresponsive HEENT:  Moist mucus membranes Cardiovascular:  S1, S2, RRR, no MRG Lungs:  Clear, No wheeze or crackles. Abdomen:  + BS, soft, non tender, non distended. Skin:  Intact. Multiple tattos  Recent Labs Lab 03/09/2015 2121  03-11-15 1740 11-Mar-2015 1930 02/25/15 0100 02/25/15 0555  NA 135  < > 146* 142 147* 150*  K 3.4*  --  4.3  --   --  3.6  CL 106  --   --   --   --  122*  CO2 20*  --   --   --   --  23  BUN 10  --   --   --   --  7  CREATININE 0.93   --   --   --   --  0.71  GLUCOSE 192*  --  167*  --   --  135*  < > = values in this interval not displayed.  Recent Labs Lab 03/08/2015 2121 2015-03-11 1740 02/25/15 0555  HGB 16.5 14.3 13.1  HCT 47.7 42.0 41.4  WBC 17.4*  --  15.4*  PLT 212  --  170   ASSESSMENT / PLAN: Acute respiratory failure secondary to encephalopathy caused by the intraparenchymal hematoma with vasogenic edema..  PS but no extubation until code status/family wishes are addressed. CXR and ABG in AM. Start TF. 3% saline. Cardene for BP control. Will start PO in AM if continues to be hypertensive. Need to discuss plan of care with family.  The patient is critically ill with multiple organ systems failure and requires high complexity decision making for assessment and support, frequent evaluation and titration of therapies, application of advanced monitoring technologies and extensive interpretation of multiple databases.   Critical Care Time devoted to patient care services described in this note is  35  Minutes. This time reflects time of care of this signee Dr Koren Bound. This critical care time does not reflect procedure time, or teaching time or supervisory time of PA/NP/Med student/Med Resident etc but could involve care discussion time.  Alyson Reedy, M.D. [facility] Pulmonary/Critical Care Medicine. Pager: [phone].  After hours pager: [phone].  02/25/2015, 9:27 AM

## 2015-02-25 NOTE — Progress Notes (Signed)
SLP Cancellation Note  Patient Details Name: Dennis Anthony MRN: [id number] DOB: Jan 12, 1966   Cancelled treatment:    Reason Eval/Treat Not Completed: Patient not medically ready. Pt intubated and sedated. Will sign off and await new orders.       Blenda Mounts Laurice 02/25/2015, 1:37 PM

## 2015-02-25 NOTE — Care Management Note (Signed)
Case Management Note  Patient Details  Name: Dennis Anthony MRN: [id number] Date of Birth: Dec 26, 1965  Subjective/Objective:     Pt admitted on 02/26/2015 s/p Rt frontal ICH.   PTA, pt independent of ADLS.                Action/Plan: Pt remains sedated and on ventilator.  Will continue to follow for discharge planning as pt progresses.    Expected Discharge Date:                  Expected Discharge Plan:  IP Rehab Facility  In-House Referral:     Discharge planning Services  CM Consult  Post Acute Care Choice:    Choice offered to:     DME Arranged:    DME Agency:     HH Arranged:    HH Agency:     Status of Service:  In process, will continue to follow  Medicare Important Message Given:    Date Medicare IM Given:    Medicare IM give by:    Date Additional Medicare IM Given:    Additional Medicare Important Message give by:     If discussed at Long Length of Stay Meetings, dates discussed:    Additional Comments:  Quintella Baton, RN, BSN  Trauma/Neuro ICU Case Manager [phone]

## 2015-02-25 NOTE — Progress Notes (Signed)
Pt seen and examined. No issues overnight.   EXAM: Temp:  [98.9 F (37.2 C)-100.2 F (37.9 C)] 99.1 F (37.3 C) (10/04 0800) Pulse Rate:  [70-105] 73 (10/04 0700) Resp:  [13-22] 16 (10/04 0700) BP: (111-134)/(57-90) 128/79 mmHg (10/04 0700) SpO2:  [98 %-100 %] 100 % (10/04 0700) Arterial Line BP: (98-162)/(60-81) 158/80 mmHg (10/04 0700) FiO2 (%):  [40 %] 40 % (10/04 0700) Intake/Output      10/03 0701 - 10/04 0700 10/04 0701 - 10/05 0700   I.V. (mL/kg) 2262.3 (27.4)    NG/GT 30    IV Piggyback 240    Total Intake(mL/kg) 2532.3 (30.7)    Urine (mL/kg/hr) 2425 (1.2)    Blood 400 (0.2)    Total Output 2825     Net -292.7           Opens eyes to pain Pupils reactive Breathes over vent W/D RUE, RLE Minimal movement LUE/LLE Dressing c/d/i  LABS: Lab Results  Component Value Date   CREATININE 0.71 02/25/2015   BUN 7 02/25/2015   NA 150* 02/25/2015   K 3.6 02/25/2015   CL 122* 02/25/2015   CO2 23 02/25/2015   Lab Results  Component Value Date   WBC 15.4* 02/25/2015   HGB 13.1 02/25/2015   HCT 41.4 02/25/2015   MCV 91.6 02/25/2015   PLT 170 02/25/2015    IMPRESSION: - 49 y.o. male POD#1 s/p right craniotomy, evac of hematoma, clipping of acom aneurysm  PLAN: - Cont 3% @ 35 for now - Can repeat CTH today - Cont supportive care - Low threshold for starting nicardipene for SBP goal < 

## 2015-02-25 NOTE — Progress Notes (Signed)
Pt Sodium level 147. 3% hypertonic saline running @ 43ml/hr. Called MD to verify continuing rate at 36ml/hr. No new orders given.

## 2015-02-25 NOTE — Progress Notes (Signed)
Patient transported to and from CT uneventful.  

## 2015-02-25 NOTE — Progress Notes (Addendum)
STROKE TEAM PROGRESS NOTE  Dennis Anthony is an 48 y.o. male hx of bipolar disorder, polysubstance abuse transferred from [facility] ED after presenting with AMS and questionable seizure episode. BP upon arrival was 156/110. CT head performed at that time (imaging reviewed) which showed a 6.6 x 3.8 cm right inferior frontal lobe ICH with 6mm right-to-left subfalcine herniation. Patient was intubated in the ED for airway protection. Prior to intubation, ED attending noted patient was minimally following commands and moving right side. ED attending (Dr Blinda Leatherwood) discussed case and reviewed images with neurosurgery (Dr Othelia Pulling) who did not feel that the patient required immediate surgical intervention.   Date last known well: 03-07-15 Time last known well: unclear tPA Given: no, ICH Modified Rankin: Rankin Score=0  ICH Score: 3 SUBJECTIVE (INTERVAL HISTORY) Patient had craniotomy yesterday with evacuation of hematoma and surgical clipping of the ACoM aneurysm. Follow-up CT scan from this morning shows increase in hematoma size and persistent but decreased right to left brain midline shift Blood pressure has been adequately controlled. No more seizures witnessed. No family available at the bedside. Hypertonic saline continued and last Na 153 OBJECTIVE Temp:  [98.9 F (37.2 C)-100.1 F (37.8 C)] 99.9 F (37.7 C) (10/04 1153) Pulse Rate:  [66-95] 91 (10/04 1200) Cardiac Rhythm:  [-] Normal sinus rhythm (10/04 1000) Resp:  [13-24] 24 (10/04 1200) BP: (111-174)/(57-92) 121/68 mmHg (10/04 1200) SpO2:  [95 %-100 %] 95 % (10/04 1200) Arterial Line BP: (98-175)/(60-88) 139/63 mmHg (10/04 1200) FiO2 (%):  [40 %] 40 % (10/04 1200) Weight:  [183 lb 6.8 oz (83.2 kg)] 183 lb 6.8 oz (83.2 kg) (10/04 1100)  CBC:   Recent Labs Lab March 07, 2015 2121 03/24/2015 1740 02/25/15 0555  WBC 17.4*  --  15.4*  NEUTROABS 11.6*  --   --   HGB 16.5 14.3 13.1  HCT 47.7 42.0 41.4  MCV 88.7  --  91.6  PLT 212  --  170     Basic Metabolic Panel:  Recent Labs Lab 03/07/15 2121  03/11/2015 1740  02/25/15 0555 02/25/15 1144  NA 135  < > 146*  < > 150* 153*  K 3.4*  --  4.3  --  3.6  --   CL 106  --   --   --  122*  --   CO2 20*  --   --   --  23  --   GLUCOSE 192*  --  167*  --  135*  --   BUN 10  --   --   --  7  --   CREATININE 0.93  --   --   --  0.71  --   CALCIUM 8.4*  --   --   --  8.7*  --   MG  --   --   --   --  2.0  --   PHOS  --   --   --   --  2.8  --   < > = values in this interval not displayed.  Lipid Panel:     Component Value Date/Time   TRIG 165* 03/14/2015 0151   HgbA1c: No results found for: HGBA1C Urine Drug Screen:     Component Value Date/Time   LABOPIA NONE DETECTED 2015/03/07 2200   COCAINSCRNUR NONE DETECTED 07-Mar-2015 2200   LABBENZ NONE DETECTED 03-07-15 2200   AMPHETMU NONE DETECTED 2015/03/07 2200   THCU NONE DETECTED 03-07-15 2200   LABBARB NONE DETECTED March 07, 2015 2200  IMAGING  Ct Angio Head W/cm &/or Wo Cm 03/15/2015   Right anterior frontal lobe hematoma has increased slightly in size now 7.2 x 4.4 cm versus prior 7 x 4.4 cm.  Midline shift to the left by 1.3 cm versus prior 1.2 cm.  Moderate to large amount of intraventricular blood.  Trapping of the temporal horns greater on the left.  More evident is subarachnoid blood and/ or pseudo subarachnoid appearance from diffuse edema.  Calcification right parietal lobe consistent with underlying arteriovenous malformation.  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  Enlarged right middle cerebral artery branch supplying right parietal lobe larger arteriovenous malformation. Draining veins towards the superior sagittal sinus.  Mild calcification right internal carotid artery cavernous segment without high-grade stenosis as questioned on MR.  Proximal basilar artery fenestration without saccular aneurysm.     Dg Chest 2 View 02/26/2015   1. No acute cardiopulmonary process  seen. 2. Multiple chronic left-sided rib deformities noted.     Ct Head Wo Contrast 15-Mar-2015   Right anterior frontal lobe hematoma has increased slightly in size now 7.2 x 4.4 cm versus prior 7 x 4.4 cm.  Midline shift to the left by 1.3 cm versus prior 1.2 cm.  Moderate to large amount of intraventricular blood.  Trapping of the temporal horns greater on the left.  More evident is subarachnoid blood and/ or pseudo subarachnoid appearance from diffuse edema.  Calcification right parietal lobe consistent with underlying arteriovenous malformation.  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  Enlarged right middle cerebral artery branch supplying right parietal lobe larger arteriovenous malformation. Draining veins towards the superior sagittal sinus.  Mild calcification right internal carotid artery cavernous segment without high-grade stenosis as questioned on MR.  Proximal basilar artery fenestration without saccular aneurysm   03/18/2015   6.6 x 3.8 cm hematoma, resulting in 6 mm RIGHT to LEFT subfalcine herniation.  Intraventricular extension without hydrocephalus.  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    MRI HEAD 2015-03-15   Enlarging 7 x 4.4 cm RIGHT inferior frontal lobe intraparenchymal hematoma, worsening mass-effect with 12 mm RIGHT to LEFT subfalcine herniation.  Intraventricular extension of hemorrhage, new mild hydrocephalus.    MRA HEAD March 15, 2015   11 x 7 mm LEFT A1-2 junction aneurysm, cause of RIGHT frontal lobe hemorrhage.  Partially characterized RIGHT parietal AVM, and enlarged RIGHT middle cerebral feeding arteries. Large draining vein to the superior sagittal sinus seen on MRI.     Dg Chest Port 1 View 03-15-2015   1. Left IJ line noted ending about the distal SVC. 2. Mild left basilar airspace opacity likely reflects atelectasis. Lungs otherwise grossly clear.    03/10/2015    1. Endotracheal tube seen  ending 4-5 cm above the carina. 2. Enteric tube noted extending below the diaphragm. 3. Vascular congestion, with increased interstitial markings, more prominent on the left, new from the recent prior study. This may reflect mildly asymmetric interstitial edema.     EEG abnormal electroencephalogram secondary to the decreased amplitude noted over the right hemisphere. This is consistent with the patient's history of a right ICH. No epileptiform activity is noted.     PHYSICAL EXAM Obese middle-age Caucasian male who is intubated   . Afebrile. Head is nontraumatic. Neck is supple without bruit.    Cardiac exam no murmur or gallop. Lungs are clear to auscultation. 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 Eyes are closed. Right pupil 3 mm left to mimic the both sluggishly reactive. Doll's eye movements are present with slight right gaze preference. Does not blink to threat. Fundi were not visualized. No facial grimacing to painful stimuli. Patient response to sternal rub with semipurposeful flexion on the right upper greater than lower extremity and slight extensor posturing in the left upper extremity and no response in left lower extremity. Deep tendon reflexes are 2+ symmetric plantars downgoing. ASSESSMENT/PLAN Mr. Dennis Anthony is a 49 y.o. male with history of bipolar disorder, polysubstance abuse presenting with possible seizure. CT in ED with R frontal ICH with 6mm right to left  Brain herniation. Intubated in the ED.   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 Patient also has right parietal calcified AVM which does not appear to have bled and patient's aneurysms may be flow related s/o frontal craniotomy with clot evacuation and surgical clipping of ACoM aneurysm on  03/24/2015  Resultant  comatose state with left hemiplegia  MRI  Enlarging R inferior frontal lobe IPH, with worsening mass effect w/ 12 mm R to L subfalcine herniation  MRA  L A1-2 R junction aneurysm,  RIGHT parietal AVM with enlarged RIGHT middle cerebral feeding arteries.  EEG pending   Transcranial Doppler  Ordered M, W, F  SCDs for VTE prophylaxis Diet NPO time specified  no antithrombotic prior to admission  Therapy recommendations:  pending   Disposition:  pending   Cytotoxic Cerebral Edema  Started on 3% - sodium at goal 153 today  Acute Respiratory Failure  Intubated in ED  Hypertension  Stable  Other Active Problems  bipoloar disorder/schizoaffective disorder  Hospital day # 2 I have personally examined this patient, reviewed notes, independently viewed imaging studies, participated in medical decision making and plan of care. I have made any additions or clarifications directly to the above note. Patient has presented with a seizure and unresponsiveness secondary to large right frontal mostly parenchymal hemorrhage secondary to complex bilobed anterior communicating artery aneurysm which is likely flow related due to   right parietal calcified AV malformation. Recommend strict control of blood pressure with goal systolic blood pressure below 295. Strict control of temperature with normothermia and glucose is euglycemia.  . Continue hypertonic saline to decrease cytotoxic edema with sodium goal 150-155.  Discussed with Dr. Alease Frame .Start nimodipine for vasospasm prevention.Patient is at significant risk for neurological worsening with brain herniation, hydrocephalus and dying.This patient is critically ill and at significant risk of neurological worsening, death and care requires constant monitoring of vital signs, hemodynamics,respiratory and cardiac monitoring, extensive review of multiple databases, frequent neurological assessment, discussion with family, other  specialists and medical decision making of high complexity.I have made any additions or clarifications directly to the above note.This critical care time does not reflect procedure time, or teaching time or supervisory time of PA/NP/Med Resident etc but could involve care discussion time.  I spent 35 minutes of neurocritical care time  in the care of  this patient. No family available for discusssion  Delia Heady, MD   Delia Heady, MD Medical Director [facility] Pager: [phone] 02/25/2015 1:24 PM      To contact Stroke Continuity provider, please refer to [email or web address]. After hours, contact General Neurology

## 2015-02-25 NOTE — Progress Notes (Signed)
   02/25/15 1721  Clinical Encounter Type  Visited With Patient;Health care provider  Visit Type Initial  Referral From Family;Nurse  Spiritual Encounters  Spiritual Needs Prayer   Per request from patient's family, chaplain met with patient and offered prayer. Chaplain support available as needed.   Jeri Lager, Chaplain 02/25/2015 5:22 PM

## 2015-02-25 NOTE — Progress Notes (Signed)
Nutrition Follow-up  INTERVENTION:   Initiate Vital 1.2 @ 20 ml/hr via OG tube and increase by 10 ml every 4 hours to goal rate of 70 ml/hr.   Tube feeding regimen provides 2016 kcal (100% of needs), 136 grams of protein, and 1362 ml of H2O.    NUTRITION DIAGNOSIS:   Inadequate oral intake related to inability to eat as evidenced by NPO status. Ongoing.   GOAL:   Patient will meet greater than or equal to 90% of their needs Not yet met.   MONITOR:   Vent status, TF tolerance, Weight trends, Labs, I & O's  REASON FOR ASSESSMENT:   Consult Enteral/tube feeding initiation and management  ASSESSMENT:   49 year old Caucasian male with past medical history significant for psychiatric issues including bipolar disorder schizoaffective disorder and polysubstance abuse who, per report, presents to ED at [facility] with complaints of a brief episode of loss of consciousness and shaking. CT of the head was performed which noted a 6.6 x 3.8 cm right inferior frontal lobe intraparenchymal hematoma with surrounding low-density vasogenic edema. Also noted to have a 6 mm right-to-left subfalcine herniation with a trace amount of subarachnoid hemorrhage. The patient was intubated for airway protection and transferred to our neuro ICU.   Patient is currently intubated on ventilator support MV: 9.6 L/min Temp (24hrs), Avg:99.4 F (37.4 C), Min:98.9 F (37.2 C), Max:100.1 F (37.8 C)  Labs: sodium elevated on 3% Spoke with RN at bedside.   Diet Order:  Diet NPO time specified  Skin:  Reviewed, no issues  Last BM:  unknown  Height:   Ht Readings from Last 1 Encounters:  03/09/2015 _0  (1.753 m)    Weight:   Wt Readings from Last 1 Encounters:  02/25/15 183 lb 6.8 oz (83.2 kg)    Ideal Body Weight:  72.7 kg  BMI:  Body mass index is 27.07 kg/(m^2).  Estimated Nutritional Needs:   Kcal:  2021  Protein:  115-130 grams  Fluid:  > 2 L/day  EDUCATION NEEDS:   No  education needs identified at this time  [place], [place], [place] Pager [phone] After Hours Pager

## 2015-02-25 NOTE — Progress Notes (Signed)
Transcranial Doppler  Date POD PCO2 HCT BP  MCA ACA PCA OPHT SIPH VERT Basilar  02/25/15 1 34.8 41.4 172/78 Right  Left   154  32   -61  -39   -  -   35  29   -  -   -  -28     -25         Right  Left                                            Right  Left                                             Right  Left                                             Right  Left                                            Right  Left                                            Right  Left                                        MCA = Middle Cerebral Artery      OPHT = Opthalmic Artery     BASILAR = Basilar Artery   ACA = Anterior Cerebral Artery     SIPH = Carotid Siphon PCA = Posterior Cerebral Artery   VERT = Verterbral Artery                   Normal MCA = 62+\-12 ACA = 50+\-12 PCA = 42+\-23   -Unable to insonate  02/25/2015- Right MCA velocities discussed with Dr. Pearlean Brownie.  02/25/2015 4:58 PM Gertie Fey, RVT, RDCS, RDMS

## 2015-02-25 NOTE — Progress Notes (Signed)
Pt's brother, Okie Bogacz, stated pt was admitted in June 2016 at [facility] in [place] for suicide attempt but that pt has improved since recent d/c to [facility]. He states that pt was not taking care of self/taking meds appropriately and also stated that when pt "is on his medications he is fine".

## 2015-02-25 NOTE — Transfer of Care (Signed)
Immediate Anesthesia Transfer of Care Note  Patient: Dennis Anthony  Procedure(s) Performed: Procedure(s): RADIOLOGY WITH ANESTHESIA (N/A)  Patient Location: ICU  Anesthesia Type:General  Level of Consciousness: Patient remains intubated per anesthesia plan  Airway & Oxygen Therapy: Patient remains intubated per anesthesia plan and Patient placed on Ventilator (see vital sign flow sheet for setting)  Post-op Assessment: Report given to RN and Post -op Vital signs reviewed and stable  Post vital signs: Reviewed and stable  Last Vitals:  Filed Vitals:   02/25/15 0326  BP:   Pulse: 77  Temp:   Resp: 15    Complications: No apparent anesthesia complications

## 2015-02-25 NOTE — Progress Notes (Signed)
Pt Sodium level 150 this am. Hypertonic saline running at 81ml/hr. Notified MD. Orders given to cut 3% in half. Orders followed though, 3% now running at 43ml/hr.

## 2015-02-26 ENCOUNTER — Inpatient Hospital Stay (HOSPITAL_COMMUNITY): Payer: Medicare HMO

## 2015-02-26 ENCOUNTER — Encounter (HOSPITAL_COMMUNITY): Payer: Self-pay | Admitting: Neurosurgery

## 2015-02-26 DIAGNOSIS — R4020 Unspecified coma: Secondary | ICD-10-CM

## 2015-02-26 DIAGNOSIS — J9600 Acute respiratory failure, unspecified whether with hypoxia or hypercapnia: Secondary | ICD-10-CM

## 2015-02-26 DIAGNOSIS — G459 Transient cerebral ischemic attack, unspecified: Secondary | ICD-10-CM

## 2015-02-26 DIAGNOSIS — I67848 Other cerebrovascular vasospasm and vasoconstriction: Secondary | ICD-10-CM

## 2015-02-26 DIAGNOSIS — G936 Cerebral edema: Secondary | ICD-10-CM

## 2015-02-26 DIAGNOSIS — I602 Nontraumatic subarachnoid hemorrhage from anterior communicating artery: Secondary | ICD-10-CM | POA: Diagnosis not present

## 2015-02-26 LAB — MAGNESIUM: Magnesium: 2.2 mg/dL (ref 1.7–2.4)

## 2015-02-26 LAB — URINALYSIS, ROUTINE W REFLEX MICROSCOPIC
BILIRUBIN URINE: NEGATIVE
Glucose, UA: NEGATIVE mg/dL
KETONES UR: NEGATIVE mg/dL
Leukocytes, UA: NEGATIVE
NITRITE: NEGATIVE
Protein, ur: NEGATIVE mg/dL
SPECIFIC GRAVITY, URINE: 1.018 (ref 1.005–1.030)
UROBILINOGEN UA: 1 mg/dL (ref 0.0–1.0)
pH: 5.5 (ref 5.0–8.0)

## 2015-02-26 LAB — GLUCOSE, CAPILLARY
Glucose-Capillary: 120 mg/dL — ABNORMAL HIGH (ref 65–99)
Glucose-Capillary: 134 mg/dL — ABNORMAL HIGH (ref 65–99)
Glucose-Capillary: 142 mg/dL — ABNORMAL HIGH (ref 65–99)
Glucose-Capillary: 157 mg/dL — ABNORMAL HIGH (ref 65–99)
Glucose-Capillary: 178 mg/dL — ABNORMAL HIGH (ref 65–99)
Glucose-Capillary: 180 mg/dL — ABNORMAL HIGH (ref 65–99)

## 2015-02-26 LAB — BLOOD GAS, ARTERIAL
ACID-BASE DEFICIT: 4.7 mmol/L — AB (ref 0.0–2.0)
Bicarbonate: 18.3 mEq/L — ABNORMAL LOW (ref 20.0–24.0)
DRAWN BY: 345601
FIO2: 0.4
MECHVT: 560 mL
O2 SAT: 94 %
PATIENT TEMPERATURE: 99.2
PCO2 ART: 25.5 mmHg — AB (ref 35.0–45.0)
PEEP/CPAP: 5 cmH2O
PH ART: 7.472 — AB (ref 7.350–7.450)
PO2 ART: 66.9 mmHg — AB (ref 80.0–100.0)
RATE: 14 resp/min
TCO2: 19.1 mmol/L (ref 0–100)

## 2015-02-26 LAB — BASIC METABOLIC PANEL
ANION GAP: 7 (ref 5–15)
BUN: 9 mg/dL (ref 6–20)
CALCIUM: 8.5 mg/dL — AB (ref 8.9–10.3)
CO2: 21 mmol/L — ABNORMAL LOW (ref 22–32)
CREATININE: 0.65 mg/dL (ref 0.61–1.24)
Chloride: 125 mmol/L — ABNORMAL HIGH (ref 101–111)
GFR calc Af Amer: >60 mL/min (ref >60)
GLUCOSE: 140 mg/dL — AB (ref 65–99)
Potassium: 3.3 mmol/L — ABNORMAL LOW (ref 3.5–5.1)
Sodium: 153 mmol/L — ABNORMAL HIGH (ref 135–145)

## 2015-02-26 LAB — CBC
HCT: 41.1 % (ref 39.0–52.0)
Hemoglobin: 13 g/dL (ref 13.0–17.0)
MCH: 28.9 pg (ref 26.0–34.0)
MCHC: 31.6 g/dL (ref 30.0–36.0)
MCV: 91.3 fL (ref 78.0–100.0)
PLATELETS: 173 10*3/uL (ref 150–400)
RBC: 4.5 MIL/uL (ref 4.22–5.81)
RDW: 15.6 % — AB (ref 11.5–15.5)
WBC: 14.8 10*3/uL — AB (ref 4.0–10.5)

## 2015-02-26 LAB — SODIUM
SODIUM: 155 mmol/L — AB (ref 135–145)
SODIUM: 155 mmol/L — AB (ref 135–145)
Sodium: 150 mmol/L — ABNORMAL HIGH (ref 135–145)
Sodium: 153 mmol/L — ABNORMAL HIGH (ref 135–145)

## 2015-02-26 LAB — URINE MICROSCOPIC-ADD ON

## 2015-02-26 LAB — PHOSPHORUS: Phosphorus: 1.4 mg/dL — ABNORMAL LOW (ref 2.5–4.6)

## 2015-02-26 MED ORDER — NOREPINEPHRINE BITARTRATE 1 MG/ML IV SOLN
0.0000 ug/min | INTRAVENOUS | Status: DC
  Administered 2015-02-26: 2 ug/min via INTRAVENOUS
  Administered 2015-02-27: 40 ug/min via INTRAVENOUS
  Filled 2015-02-26 (×2): qty 4

## 2015-02-26 MED ORDER — PHENYLEPHRINE HCL 10 MG/ML IJ SOLN
0.0000 ug/min | INTRAVENOUS | Status: DC
  Administered 2015-02-26: 130 ug/min via INTRAVENOUS
  Filled 2015-02-26: qty 4

## 2015-02-26 MED ORDER — SODIUM CHLORIDE 0.9 % IV SOLN
INTRAVENOUS | Status: DC
  Administered 2015-02-26 – 2015-03-03 (×4): via INTRAVENOUS

## 2015-02-26 MED ORDER — POTASSIUM PHOSPHATES 15 MMOLE/5ML IV SOLN
30.0000 mmol | Freq: Once | INTRAVENOUS | Status: AC
  Administered 2015-02-26: 30 mmol via INTRAVENOUS
  Filled 2015-02-26: qty 10

## 2015-02-26 MED ORDER — METOPROLOL TARTRATE 25 MG/10 ML ORAL SUSPENSION
12.5000 mg | Freq: Two times a day (BID) | ORAL | Status: DC
  Administered 2015-02-26 – 2015-02-27 (×3): 12.5 mg
  Filled 2015-02-26 (×3): qty 10

## 2015-02-26 MED ORDER — PHENYLEPHRINE HCL 10 MG/ML IJ SOLN
0.0000 ug/min | INTRAVENOUS | Status: DC
  Administered 2015-02-26: 50 ug/min via INTRAVENOUS
  Administered 2015-02-26: 110 ug/min via INTRAVENOUS
  Administered 2015-02-26: 20 ug/min via INTRAVENOUS
  Filled 2015-02-26 (×2): qty 1

## 2015-02-26 NOTE — Progress Notes (Signed)
Name: Dennis Anthony MRN: [id number] DOB: [birth year]    ADMISSION DATE:  02/26/2015 CONSULTATION DATE:  2015/03/18  REFERRING MD :  Dr Hosie Poisson  CHIEF COMPLAINT:  Acute resp failure  REASON FOR CONSULTATION: vent management  HISTORY OF PRESENT ILLNESS:  Patient is a 49 year old Caucasian male with past medical history significant for psychiatric issues including bipolar disorder schizoaffective disorder and polysubstance abuse who, per report, presents to emergency department at [facility] with complaints of a brief episode of loss of consciousness and shaking. There is question on whether or not this is seizure-like activity noted by EMS. CT of the head was performed which noted a 6.6 x 3.8 cm right inferior frontal lobe intraparenchymal hematoma with surrounding low-density vasogenic edema. Also noted to have a 6 mm right-to-left subfalcine herniation with a trace amount of subarachnoid hemorrhage. The patient was intubated for airway protection and transferred to our neuro ICU.   SUBJECTIVE:  No events overnight, unresponsive.   VITAL SIGNS: Temp:  [98.2 F (36.8 C)-101.4 F (38.6 C)] 101.4 F (38.6 C) (10/05 1147) Pulse Rate:  [71-131] 97 (10/05 1224) Resp:  [15-35] 24 (10/05 1224) BP: (119-172)/(53-88) 145/78 mmHg (10/05 1224) SpO2:  [91 %-100 %] 99 % (10/05 1224) Arterial Line BP: (119-182)/(53-93) 169/78 mmHg (10/05 1200) FiO2 (%):  [40 %] 40 % (10/05 1224) Weight:  [82.7 kg (182 lb 5.1 oz)] 82.7 kg (182 lb 5.1 oz) (10/05 1018)  PHYSICAL EXAMINATION: General: Sedated, intubated Neuro:  Unresponsive HEENT:  Moist mucus membranes Cardiovascular:  S1, S2, RRR, no MRG Lungs:  Clear, No wheeze or crackles. Abdomen:  + BS, soft, non tender, non distended. Skin:  Intact. Multiple tattos  Recent Labs Lab 03/09/2015 2121  2015-03-18 1740  02/25/15 0555  02/25/15 2330 02/26/15 0623 02/26/15 0950  NA 135  < > 146*  < > 150*  < > 150* 153* 155*  K 3.4*  --  4.3  --  3.6  --   --   3.3*  --   CL 106  --   --   --  122*  --   --  125*  --   CO2 20*  --   --   --  23  --   --  21*  --   BUN 10  --   --   --  7  --   --  9  --   CREATININE 0.93  --   --   --  0.71  --   --  0.65  --   GLUCOSE 192*  --  167*  --  135*  --   --  140*  --   < > = values in this interval not displayed.  Recent Labs Lab 03/10/2015 2121 18-Mar-2015 1740 02/25/15 0555 02/26/15 0623  HGB 16.5 14.3 13.1 13.0  HCT 47.7 42.0 41.4 41.1  WBC 17.4*  --  15.4* 14.8*  PLT 212  --  170 173   ASSESSMENT / PLAN: Acute respiratory failure secondary to encephalopathy caused by the intraparenchymal hematoma with vasogenic edema..  PS but no extubation until code status/family wishes are addressed, neurology spearheading the effort, family is clear not wanting trach/peg. CXR and ABG in AM. Continue TF. 3% saline per neuro. Cardene for BP control. Start PO beta blockers for BP and HR control. Need to discuss plan of care with family after notifying neurosurgery. K3PO4 ordered. Labs in AM ordered.  The patient is critically ill with multiple organ systems failure  and requires high complexity decision making for assessment and support, frequent evaluation and titration of therapies, application of advanced monitoring technologies and extensive interpretation of multiple databases.   Critical Care Time devoted to patient care services described in this note is  35  Minutes. This time reflects time of care of this signee Dr Koren Bound. This critical care time does not reflect procedure time, or teaching time or supervisory time of PA/NP/Med student/Med Resident etc but could involve care discussion time.  Alyson Reedy, M.D. [facility] Pulmonary/Critical Care Medicine. Pager: [phone]. After hours pager: [phone].  02/26/2015, 2:24 PM

## 2015-02-26 NOTE — Progress Notes (Signed)
Dr. Conchita Paris aware of pt's R MCA velocities on TCD. Order received to start Neosynephrine with goal SBP 180 and add Normal Saline IV  cc/hr.

## 2015-02-26 NOTE — Progress Notes (Signed)
Pt seen and examined. No issues overnight.   EXAM: Temp:  [98.2 F (36.8 C)-100.8 F (38.2 C)] 100.7 F (38.2 C) (10/05 0756) Pulse Rate:  [71-131] 115 (10/05 0841) Resp:  [15-35] 32 (10/05 0841) BP: (119-172)/(53-88) 167/77 mmHg (10/05 0841) SpO2:  [89 %-100 %] 97 % (10/05 0841) Arterial Line BP: (133-182)/(53-93) 158/68 mmHg (10/05 0800) FiO2 (%):  [40 %] 40 % (10/05 0841) Weight:  [83.2 kg (183 lb 6.8 oz)] 83.2 kg (183 lb 6.8 oz) (10/04 1100) Intake/Output      10/04 0701 - 10/05 0700 10/05 0701 - 10/06 0700   I.V. (mL/kg) 1266.4 (15.2) 70 (0.8)   NG/GT 659.5 101.3   IV Piggyback 105    Total Intake(mL/kg) 2030.9 (24.4) 171.3 (2.1)   Urine (mL/kg/hr) 2710 (1.4) 125 (0.8)   Blood     Total Output 2710 125   Net -679.1 +46.3         Opens eyes to pain Pupils reactive Breathes over vent Briskly localizes RUE, ?FC RLE Minimal movements LUE/LLE  LABS: Lab Results  Component Value Date   CREATININE 0.65 02/26/2015   BUN 9 02/26/2015   NA 153* 02/26/2015   K 3.3* 02/26/2015   CL 125* 02/26/2015   CO2 21* 02/26/2015   Lab Results  Component Value Date   WBC 14.8* 02/26/2015   HGB 13.0 02/26/2015   HCT 41.1 02/26/2015   MCV 91.3 02/26/2015   PLT 173 02/26/2015    IMPRESSION: - 49 y.o. male hemorrhage d# 3, s/p Acom clipping, evac of hematoma. Appears neurologically unchanged. - TCD shows increased velocity on right - Febrile  PLAN: - Will cont hypertonic saline , at goal Na now. - Allow SBP up to - Nimotop, TCD monitoring - Wean vent as tolerated per PCCM - Cont TF - Send urine/blood/sputum cx

## 2015-02-26 NOTE — Progress Notes (Signed)
Transcranial Doppler  Date POD PCO2 HCT BP  MCA ACA PCA OPHT SIPH VERT Basilar  02/25/15 MS  34.8 41.4 172/78 Right  Left   154  32   -61  -39   -  -   35  29   -  -   -  -28     -25    02/26/15 MS  25.5 41.1 176/84 Right  Left   175 21     -39  -57   55 - -  -   -  -37     -54         Right  Left                                             Right  Left                                             Right  Left                                            Right  Left                                            Right  Left                                        MCA = Middle Cerebral Artery      OPHT = Opthalmic Artery     BASILAR = Basilar Artery   ACA = Anterior Cerebral Artery     SIPH = Carotid Siphon PCA = Posterior Cerebral Artery   VERT = Verterbral Artery                   Normal MCA = 62+\-12 ACA = 50+\-12 PCA = 42+\-23   -Unable to insonate  02/25/2015- Right MCA velocities discussed with Dr. Pearlean Brownie.   02/26/2015 5:04 PM Gertie Fey, RVT, RDCS, RDMS

## 2015-02-26 NOTE — Progress Notes (Signed)
STROKE TEAM PROGRESS NOTE  Dennis Anthony is an 49 y.o. male hx of bipolar disorder, polysubstance abuse transferred from [facility] ED after presenting with AMS and questionable seizure episode. BP upon arrival was 156/110. CT head performed at that time (imaging reviewed) which showed a 6.6 x 3.8 cm right inferior frontal lobe ICH with 6mm right-to-left subfalcine herniation. Patient was intubated in the ED for airway protection. Prior to intubation, ED attending noted patient was minimally following commands and moving right side. ED attending (Dr Blinda Leatherwood) discussed case and reviewed images with neurosurgery (Dr Othelia Pulling) who did not feel that the patient required immediate surgical intervention.   Date last known well: 02/25/2015 Time last known well: unclear tPA Given: no, ICH Modified Rankin: Rankin Score=0  ICH Score: 3 SUBJECTIVE (INTERVAL HISTORY) Patient had craniotomy yesterday with evacuation of hematoma and surgical clipping of the ACoM aneurysm. Follow-up CT scan from this morning shows increase in hematoma size and persistent but decreased right to left brain midline shift Blood pressure has been adequately controlled. No more seizures witnessed. No family available at the bedside. Hypertonic saline continued and last Na 155. TCD showed elevated Rt MCA velocities s/o mild vasospasm. OBJECTIVE Temp:  [98.2 F (36.8 C)-101.4 F (38.6 C)] 101.4 F (38.6 C) (10/05 1147) Pulse Rate:  [71-131] 97 (10/05 1224) Cardiac Rhythm:  [-] Normal sinus rhythm (10/05 0730) Resp:  [15-35] 24 (10/05 1224) BP: (119-172)/(53-88) 145/78 mmHg (10/05 1224) SpO2:  [89 %-100 %] 99 % (10/05 1224) Arterial Line BP: (119-182)/(53-93) 169/78 mmHg (10/05 1200) FiO2 (%):  [40 %] 40 % (10/05 1224) Weight:  [182 lb 5.1 oz (82.7 kg)] 182 lb 5.1 oz (82.7 kg) (10/05 1018)  CBC:   Recent Labs Lab 02/26/2015 2121  02/25/15 0555 02/26/15 0623  WBC 17.4*  --  15.4* 14.8*  NEUTROABS 11.6*  --   --   --   HGB 16.5  <  > 13.1 13.0  HCT 47.7  < > 41.4 41.1  MCV 88.7  --  91.6 91.3  PLT 212  --  170 173  < > = values in this interval not displayed.  Basic Metabolic Panel:  Recent Labs Lab 02/25/15 0555  02/26/15 0623 02/26/15 0950  NA 150*  < > 153* 155*  K 3.6  --  3.3*  --   CL 122*  --  125*  --   CO2 23  --  21*  --   GLUCOSE 135*  --  140*  --   BUN 7  --  9  --   CREATININE 0.71  --  0.65  --   CALCIUM 8.7*  --  8.5*  --   MG 2.0  --  2.2  --   PHOS 2.8  --  1.4*  --   < > = values in this interval not displayed.  Lipid Panel:     Component Value Date/Time   TRIG 165* 02/28/2015 0151   HgbA1c: No results found for: HGBA1C Urine Drug Screen:     Component Value Date/Time   LABOPIA NONE DETECTED 02/22/2015 2200   COCAINSCRNUR NONE DETECTED 03/19/2015 2200   LABBENZ NONE DETECTED 03/02/2015 2200   AMPHETMU NONE DETECTED 02/27/2015 2200   THCU NONE DETECTED 03/14/2015 2200   LABBARB NONE DETECTED 03/23/2015 2200      IMAGING  Ct Angio Head W/cm &/or Wo Cm 03/09/2015   Right anterior frontal lobe hematoma has increased slightly in size now 7.2 x 4.4 cm versus prior  7 x 4.4 cm.  Midline shift to the left by 1.3 cm versus prior 1.2 cm.  Moderate to large amount of intraventricular blood.  Trapping of the temporal horns greater on the left.  More evident is subarachnoid blood and/ or pseudo subarachnoid appearance from diffuse edema.  Calcification right parietal lobe consistent with underlying arteriovenous malformation.  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  Enlarged right middle cerebral artery branch supplying right parietal lobe larger arteriovenous malformation. Draining veins towards the superior sagittal sinus.  Mild calcification right internal carotid artery cavernous segment without high-grade stenosis as questioned on MR.  Proximal basilar artery fenestration without saccular aneurysm.     Dg Chest 2 View 03/05/2015   1. No acute  cardiopulmonary process seen. 2. Multiple chronic left-sided rib deformities noted.     Ct Head Wo Contrast 2015/03/22   Right anterior frontal lobe hematoma has increased slightly in size now 7.2 x 4.4 cm versus prior 7 x 4.4 cm.  Midline shift to the left by 1.3 cm versus prior 1.2 cm.  Moderate to large amount of intraventricular blood.  Trapping of the temporal horns greater on the left.  More evident is subarachnoid blood and/ or pseudo subarachnoid appearance from diffuse edema.  Calcification right parietal lobe consistent with underlying arteriovenous malformation.  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  Enlarged right middle cerebral artery branch supplying right parietal lobe larger arteriovenous malformation. Draining veins towards the superior sagittal sinus.  Mild calcification right internal carotid artery cavernous segment without high-grade stenosis as questioned on MR.  Proximal basilar artery fenestration without saccular aneurysm   03/03/2015   6.6 x 3.8 cm hematoma, resulting in 6 mm RIGHT to LEFT subfalcine herniation.  Intraventricular extension without hydrocephalus.  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    MRI HEAD 03-22-15   Enlarging 7 x 4.4 cm RIGHT inferior frontal lobe intraparenchymal hematoma, worsening mass-effect with 12 mm RIGHT to LEFT subfalcine herniation.  Intraventricular extension of hemorrhage, new mild hydrocephalus.    MRA HEAD 2015-03-22   11 x 7 mm LEFT A1-2 junction aneurysm, cause of RIGHT frontal lobe hemorrhage.  Partially characterized RIGHT parietal AVM, and enlarged RIGHT middle cerebral feeding arteries. Large draining vein to the superior sagittal sinus seen on MRI.     Dg Chest Port 1 View 2015-03-22   1. Left IJ line noted ending about the distal SVC. 2. Mild left basilar airspace opacity likely reflects atelectasis. Lungs otherwise grossly clear.    03/17/2015    1.  Endotracheal tube seen ending 4-5 cm above the carina. 2. Enteric tube noted extending below the diaphragm. 3. Vascular congestion, with increased interstitial markings, more prominent on the left, new from the recent prior study. This may reflect mildly asymmetric interstitial edema.     EEG abnormal electroencephalogram secondary to the decreased amplitude noted over the right hemisphere. This is consistent with the patient's history of a right ICH. No epileptiform activity is noted.     PHYSICAL EXAM Obese middle-age Caucasian male who is intubated   . Afebrile. Head is nontraumatic. Neck is supple without bruit.    Cardiac exam no murmur or gallop. Lungs are clear to auscultation. 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 Eyes are closed. Able to follow  occasional commands on right side and move extremities against gravity Right pupil 3 mm left to mimic the both sluggishly reactive. Doll's eye movements are present with slight right gaze preference. Does not blink to threat. Fundi were not visualized. No facial grimacing to painful stimuli.  Antigravity movements on right and minimal withdrawal to pain in left upper and left lower extremity. Deep tendon reflexes are 2+ symmetric plantars downgoing. ASSESSMENT/PLAN Mr. Estes Lehner is a 49 y.o. male with history of bipolar disorder, polysubstance abuse presenting with possible seizure. CT in ED with R frontal ICH with 6mm right to left  Brain herniation. Intubated in the ED.   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 Patient also has right parietal calcified AVM which does not appear to have bled and patient's aneurysms may be flow related s/o frontal craniotomy with clot evacuation and surgical clipping of ACoM aneurysm on  03/06/2015  Resultant  comatose state with left hemiplegia  MRI  Enlarging R inferior frontal lobe IPH, with worsening mass effect w/ 12 mm R to L subfalcine herniation  MRA  L A1-2 R junction aneurysm,  RIGHT parietal AVM with enlarged RIGHT middle cerebral feeding arteries.  EEG pending   Transcranial Doppler  Ordered M, W, F  SCDs for VTE prophylaxis Diet NPO time specified  no antithrombotic prior to admission  Therapy recommendations:  pending   Disposition:  pending   Cytotoxic Cerebral Edema  Started on 3% - sodium at goal 153 today  Acute Respiratory Failure  Intubated in ED  Hypertension  Stable  Other Active Problems  bipoloar disorder/schizoaffective disorder  Hospital day # 3 I have personally examined this patient, reviewed notes, independently viewed imaging studies, participated in medical decision making and plan of care. I have made any additions or clarifications directly to the above note. Patient has presented with a seizure and unresponsiveness secondary to large right frontal mostly parenchymal hemorrhage secondary to complex bilobed anterior communicating artery aneurysm which is likely flow related due to   right parietal calcified AV malformation. Recommend strict control of blood pressure with goal systolic blood pressure below 161. Strict control of temperature with normothermia and glucose is euglycemia.  . Continue hypertonic saline to decrease cytotoxic edema with sodium goal 150-155.  Discussed with Dr. Alease Frame .Continue nimodipine for vasospasm prevention.Patient is at significant risk for neurological worsening with brain herniation, hydrocephalus and dying.This patient is critically ill and at significant risk of neurological worsening, death and care requires constant monitoring of vital signs, hemodynamics,respiratory and cardiac monitoring, extensive review of multiple databases, frequent neurological assessment, discussion with family, other  specialists and medical decision making of high complexity.I have made any additions or clarifications directly to the above note.This critical care time does not reflect procedure time, or teaching time or supervisory time of PA/NP/Med Resident etc but could involve care discussion time.  I spent 35 minutes of neurocritical care time  in the care of  this patient. No family available for discusssion  Delia Heady, MD   Delia Heady, MD Medical Director [facility] Pager: [phone] 02/26/2015 1:26 PM      To contact Stroke Continuity provider, please refer to [email or web address]. After hours, contact General Neurology

## 2015-02-26 NOTE — Progress Notes (Signed)
Temperature unable to be controlled by tylenol and ice packs, now 102.2 rectally. Cooling blanket placed per previous order.

## 2015-02-27 ENCOUNTER — Inpatient Hospital Stay (HOSPITAL_COMMUNITY): Payer: Medicare HMO

## 2015-02-27 ENCOUNTER — Encounter (HOSPITAL_COMMUNITY): Payer: Self-pay | Admitting: Radiology

## 2015-02-27 DIAGNOSIS — G911 Obstructive hydrocephalus: Secondary | ICD-10-CM

## 2015-02-27 LAB — BLOOD GAS, ARTERIAL
ACID-BASE DEFICIT: 1.8 mmol/L (ref 0.0–2.0)
BICARBONATE: 21.2 meq/L (ref 20.0–24.0)
Drawn by: 41308
FIO2: 0.4
LHR: 14 {breaths}/min
O2 SAT: 96.1 %
PATIENT TEMPERATURE: 98.6
PCO2 ART: 28.7 mmHg — AB (ref 35.0–45.0)
PEEP/CPAP: 5 cmH2O
PH ART: 7.482 — AB (ref 7.350–7.450)
TCO2: 22.1 mmol/L (ref 0–100)
VT: 560 mL
pO2, Arterial: 75.9 mmHg — ABNORMAL LOW (ref 80.0–100.0)

## 2015-02-27 LAB — CBC
HCT: 43 % (ref 39.0–52.0)
Hemoglobin: 13.5 g/dL (ref 13.0–17.0)
MCH: 28.5 pg (ref 26.0–34.0)
MCHC: 31.4 g/dL (ref 30.0–36.0)
MCV: 90.7 fL (ref 78.0–100.0)
PLATELETS: 174 10*3/uL (ref 150–400)
RBC: 4.74 MIL/uL (ref 4.22–5.81)
RDW: 15.6 % — ABNORMAL HIGH (ref 11.5–15.5)
WBC: 15.4 10*3/uL — ABNORMAL HIGH (ref 4.0–10.5)

## 2015-02-27 LAB — GLUCOSE, CAPILLARY
GLUCOSE-CAPILLARY: 166 mg/dL — AB (ref 65–99)
GLUCOSE-CAPILLARY: 209 mg/dL — AB (ref 65–99)
GLUCOSE-CAPILLARY: 158 mg/dL — AB (ref 65–99)
Glucose-Capillary: 134 mg/dL — ABNORMAL HIGH (ref 65–99)
Glucose-Capillary: 174 mg/dL — ABNORMAL HIGH (ref 65–99)
Glucose-Capillary: 178 mg/dL — ABNORMAL HIGH (ref 65–99)

## 2015-02-27 LAB — BASIC METABOLIC PANEL
Anion gap: 7 (ref 5–15)
BUN: 10 mg/dL (ref 6–20)
CO2: 23 mmol/L (ref 22–32)
Calcium: 8.2 mg/dL — ABNORMAL LOW (ref 8.9–10.3)
Chloride: 121 mmol/L — ABNORMAL HIGH (ref 101–111)
Creatinine, Ser: 0.63 mg/dL (ref 0.61–1.24)
GFR calc Af Amer: >60 mL/min (ref >60)
GLUCOSE: 233 mg/dL — AB (ref 65–99)
POTASSIUM: 3 mmol/L — AB (ref 3.5–5.1)
Sodium: 151 mmol/L — ABNORMAL HIGH (ref 135–145)

## 2015-02-27 LAB — TYPE AND SCREEN
ABO/RH(D): O POS
Antibody Screen: NEGATIVE
UNIT DIVISION: 0
Unit division: 0

## 2015-02-27 LAB — PHOSPHORUS: Phosphorus: 1.9 mg/dL — ABNORMAL LOW (ref 2.5–4.6)

## 2015-02-27 LAB — SODIUM
Sodium: 154 mmol/L — ABNORMAL HIGH (ref 135–145)
Sodium: 155 mmol/L — ABNORMAL HIGH (ref 135–145)

## 2015-02-27 LAB — URINE CULTURE: CULTURE: NO GROWTH

## 2015-02-27 LAB — MAGNESIUM: Magnesium: 2.2 mg/dL (ref 1.7–2.4)

## 2015-02-27 LAB — TRIGLYCERIDES: Triglycerides: 138 mg/dL (ref <150)

## 2015-02-27 MED ORDER — IBUPROFEN 100 MG/5ML PO SUSP
800.0000 mg | Freq: Three times a day (TID) | ORAL | Status: DC | PRN
  Administered 2015-02-28 – 2015-03-03 (×6): 800 mg via ORAL
  Filled 2015-02-27 (×9): qty 40

## 2015-02-27 MED ORDER — NOREPINEPHRINE BITARTRATE 1 MG/ML IV SOLN
0.0000 ug/min | INTRAVENOUS | Status: DC
  Administered 2015-02-27 – 2015-03-01 (×8): 40 ug/min via INTRAVENOUS
  Filled 2015-02-27 (×9): qty 16

## 2015-02-27 NOTE — Progress Notes (Signed)
I have discussed the patient's diagnosis and current condition with his family including both brothers at the bedside. Specifically, he appears to have shown some neurologic improvement over the last 48 hours, now consistently following commands on the right side despite the left hemiplegia. In this situation, I have recommended holding off on terminal wean/withdrawl of the ventilator till Mon, which will be one week after his hemorrhage. They understood our discussion and are in agreement with my recommendation.

## 2015-02-27 NOTE — Progress Notes (Signed)
Pt seen and examined. No issues overnight. Levophed started for hyperdynamic therapy.  EXAM: Temp:  [100.3 F (37.9 C)-102.8 F (39.3 C)] 101.5 F (38.6 C) (10/06 0801) Pulse Rate:  [75-129] 117 (10/06 0800) Resp:  [14-32] 26 (10/06 0800) BP: (110-257)/(53-236) 148/86 mmHg (10/06 0800) SpO2:  [97 %-100 %] 100 % (10/06 0800) Arterial Line BP: (119-209)/(54-84) 157/65 mmHg (10/05 2310) FiO2 (%):  [40 %] 40 % (10/06 0800) Weight:  [82.2 kg (181 lb 3.5 oz)-82.7 kg (182 lb 5.1 oz)] 82.2 kg (181 lb 3.5 oz) (10/06 0303) Intake/Output      10/05 0701 - 10/06 0700 10/06 0701 - 10/07 0700   I.V. (mL/kg) 3377.9 (41.1) 275 (3.3)   NG/GT 1761.3 70   IV Piggyback 635    Total Intake(mL/kg) 5774.2 (70.2) 345 (4.2)   Urine (mL/kg/hr) 4275 (2.2) 350 (3.2)   Stool 0 (0)    Total Output 4275 350   Net +1499.2 -5        Stool Occurrence 1 x     Eyes open spontaneously Pupils reactive Breathing spontaneously FC briskly RUE/RLE Minimal movements LUE/LLE  LABS: Lab Results  Component Value Date   CREATININE 0.63 02/27/2015   BUN 10 02/27/2015   NA 151* 02/27/2015   K 3.0* 02/27/2015   CL 121* 02/27/2015   CO2 23 02/27/2015   Lab Results  Component Value Date   WBC 15.4* 02/27/2015   HGB 13.5 02/27/2015   HCT 43.0 02/27/2015   MCV 90.7 02/27/2015   PLT 174 02/27/2015    IMAGING: CTH reviewed, stable hematoma, improved R->L MLS, no HCP. No evidence of large-vessel stroke  IMPRESSION: - 49 y.o. male hemorrhage d# 4 s/p clipping Acom aneurysm. Remains neurologically stable with left hemiplegia - Cultures pending  PLAN: - Cont Nimotop/TCD monitoring - Cont hyperdynamic therapy, with goal SBP ~155mmHg,

## 2015-02-27 NOTE — Progress Notes (Signed)
Name: Dennis Anthony MRN: [id number] DOB: 06-20-1965    ADMISSION DATE:  03/22/2015 CONSULTATION DATE:  02/23/2015  REFERRING MD :  Dr Hosie Poisson  CHIEF COMPLAINT:  Acute resp failure  REASON FOR CONSULTATION: vent management  HISTORY OF PRESENT ILLNESS:  Patient is a 49 year old Caucasian male with past medical history significant for psychiatric issues including bipolar disorder schizoaffective disorder and polysubstance abuse who, per report, presents to emergency department at [facility] with complaints of a brief episode of loss of consciousness and shaking. There is question on whether or not this is seizure-like activity noted by EMS. CT of the head was performed which noted a 6.6 x 3.8 cm right inferior frontal lobe intraparenchymal hematoma with surrounding low-density vasogenic edema. Also noted to have a 6 mm right-to-left subfalcine herniation with a trace amount of subarachnoid hemorrhage. The patient was intubated for airway protection and transferred to our neuro ICU.   SUBJECTIVE:  Withdraws to pain, no events overnight.   VITAL SIGNS: Temp:  [100.3 F (37.9 C)-102.8 F (39.3 C)] 101.5 F (38.6 C) (10/06 0801) Pulse Rate:  [75-129] 117 (10/06 0800) Resp:  [14-32] 26 (10/06 0800) BP: (110-257)/(53-236) 148/86 mmHg (10/06 0800) SpO2:  [97 %-100 %] 100 % (10/06 0800) Arterial Line BP: (119-209)/(54-84) 157/65 mmHg (10/05 2310) FiO2 (%):  [40 %] 40 % (10/06 0800) Weight:  [82.2 kg (181 lb 3.5 oz)-82.7 kg (182 lb 5.1 oz)] 82.2 kg (181 lb 3.5 oz) (10/06 0303)  PHYSICAL EXAMINATION: General: Intubated, following some commands. Neuro:  Withdrawing to pain. HEENT:  Moist mucus membranes Cardiovascular:  S1, S2, RRR, no MRG Lungs:  Clear, No wheeze or crackles. Abdomen:  + BS, soft, non tender, non distended. Skin:  Intact. Multiple tattos  Recent Labs Lab 02/25/15 0555  02/26/15 0623  02/26/15 1628 02/26/15 2245 02/27/15 0550  NA 150*  < > 153*  < > 155* 153* 151*  K 3.6   --  3.3*  --   --   --  3.0*  CL 122*  --  125*  --   --   --  121*  CO2 23  --  21*  --   --   --  23  BUN 7  --  9  --   --   --  10  CREATININE 0.71  --  0.65  --   --   --  0.63  GLUCOSE 135*  --  140*  --   --   --  233*  < > = values in this interval not displayed.  Recent Labs Lab 02/25/15 0555 02/26/15 0623 02/27/15 0550  HGB 13.1 13.0 13.5  HCT 41.4 41.1 43.0  WBC 15.4* 14.8* 15.4*  PLT 170 173 174   ASSESSMENT / PLAN: Acute respiratory failure secondary to encephalopathy caused by the intraparenchymal hematoma with vasogenic edema..  PS but no extubation until code status/family wishes are addressed, neurology spearheading the effort, family is clear not wanting trach/peg. Spoke with neuro surgery would like to keep supported until day 7 and that was conveyed to neurology, will likely wait til day 7 then one way extubation as per family trach/peg are not desired by patient. CXR and ABG in AM. Continue TF. 3% saline per neuro. Cardene off. Norepi at 40 mcg to achieve triple H therapy. Start PO beta blockers for BP and HR control. Need to discuss plan of care with family after notifying neurosurgery. BMET in AM. Replace electrolytes as indicated.  The patient is  critically ill with multiple organ systems failure and requires high complexity decision making for assessment and support, frequent evaluation and titration of therapies, application of advanced monitoring technologies and extensive interpretation of multiple databases.   Critical Care Time devoted to patient care services described in this note is  35  Minutes. This time reflects time of care of this signee Dr Koren Bound. This critical care time does not reflect procedure time, or teaching time or supervisory time of PA/NP/Med student/Med Resident etc but could involve care discussion time.  Alyson Reedy, M.D. [facility] Pulmonary/Critical Care Medicine. Pager: [phone]. After hours pager:  [phone].  02/27/2015, 8:37 AM

## 2015-02-27 NOTE — Progress Notes (Signed)
STROKE TEAM PROGRESS NOTE  Dennis Anthony is an 49 y.o. male hx of bipolar disorder, polysubstance abuse transferred from [facility] ED after presenting with AMS and questionable seizure episode. BP upon arrival was 156/110. CT head performed at that time (imaging reviewed) which showed a 6.6 x 3.8 cm right inferior frontal lobe ICH with 6mm right-to-left subfalcine herniation. Patient was intubated in the ED for airway protection. Prior to intubation, ED attending noted patient was minimally following commands and moving right side. ED attending (Dr Blinda Leatherwood) discussed case and reviewed images with neurosurgery (Dr Othelia Pulling) who did not feel that the patient required immediate surgical intervention.   Date last known well: 03/03/2015 Time last known well: unclear tPA Given: no, ICH Modified Rankin: Rankin Score=0  ICH Score: 3 SUBJECTIVE (INTERVAL HISTORY) Stable. Follow-up CT scan from this morning shows increase in hematoma size and persistent but decreased right to left brain midline shift Blood pressure has been adequately controlled. No more seizures witnessed. No family available at the bedside. Hypertonic saline continued and last Na 154. TCD showed elevated Rt MCA velocities s/o mild vasospasm.started on levophed to augment Bp and fluids. OBJECTIVE Temp:  [100.3 F (37.9 C)-102.8 F (39.3 C)] 102.2 F (39 C) (10/06 1300) Pulse Rate:  [75-129] 113 (10/06 1500) Cardiac Rhythm:  [-] Normal sinus rhythm (10/06 0800) Resp:  [14-32] 30 (10/06 1500) BP: (110-257)/(53-236) 147/94 mmHg (10/06 1500) SpO2:  [97 %-100 %] 100 % (10/06 1500) Arterial Line BP: (134-209)/(60-83) 157/65 mmHg (10/05 2310) FiO2 (%):  [40 %] 40 % (10/06 1500) Weight:  [181 lb 3.5 oz (82.2 kg)] 181 lb 3.5 oz (82.2 kg) (10/06 0303)  CBC:   Recent Labs Lab 03/01/2015 2121  02/26/15 0623 02/27/15 0550  WBC 17.4*  < > 14.8* 15.4*  NEUTROABS 11.6*  --   --   --   HGB 16.5  < > 13.0 13.5  HCT 47.7  < > 41.1 43.0  MCV 88.7   < > 91.3 90.7  PLT 212  < > 173 174  < > = values in this interval not displayed.  Basic Metabolic Panel:  Recent Labs Lab 02/26/15 0623  02/27/15 0550 02/27/15 1132  NA 153*  < > 151* 154*  K 3.3*  --  3.0*  --   CL 125*  --  121*  --   CO2 21*  --  23  --   GLUCOSE 140*  --  233*  --   BUN 9  --  10  --   CREATININE 0.65  --  0.63  --   CALCIUM 8.5*  --  8.2*  --   MG 2.2  --  2.2  --   PHOS 1.4*  --  1.9*  --   < > = values in this interval not displayed.  Lipid Panel:     Component Value Date/Time   TRIG 138 02/27/2015 0550   HgbA1c: No results found for: HGBA1C Urine Drug Screen:     Component Value Date/Time   LABOPIA NONE DETECTED 03/13/2015 2200   COCAINSCRNUR NONE DETECTED 03/18/2015 2200   LABBENZ NONE DETECTED 03/05/2015 2200   AMPHETMU NONE DETECTED 03/03/2015 2200   THCU NONE DETECTED 03/23/2015 2200   LABBARB NONE DETECTED 03/20/2015 2200      IMAGING  Ct Angio Head W/cm &/or Wo Cm 02/23/2015   Right anterior frontal lobe hematoma has increased slightly in size now 7.2 x 4.4 cm versus prior 7 x 4.4 cm.  Midline shift  to the left by 1.3 cm versus prior 1.2 cm.  Moderate to large amount of intraventricular blood.  Trapping of the temporal horns greater on the left.  More evident is subarachnoid blood and/ or pseudo subarachnoid appearance from diffuse edema.  Calcification right parietal lobe consistent with underlying arteriovenous malformation.  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  Enlarged right middle cerebral artery branch supplying right parietal lobe larger arteriovenous malformation. Draining veins towards the superior sagittal sinus.  Mild calcification right internal carotid artery cavernous segment without high-grade stenosis as questioned on MR.  Proximal basilar artery fenestration without saccular aneurysm.     Dg Chest 2 View 03/22/2015   1. No acute cardiopulmonary process seen. 2. Multiple chronic  left-sided rib deformities noted.     Ct Head Wo Contrast 03/10/2015   Right anterior frontal lobe hematoma has increased slightly in size now 7.2 x 4.4 cm versus prior 7 x 4.4 cm.  Midline shift to the left by 1.3 cm versus prior 1.2 cm.  Moderate to large amount of intraventricular blood.  Trapping of the temporal horns greater on the left.  More evident is subarachnoid blood and/ or pseudo subarachnoid appearance from diffuse edema.  Calcification right parietal lobe consistent with underlying arteriovenous malformation.  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  Enlarged right middle cerebral artery branch supplying right parietal lobe larger arteriovenous malformation. Draining veins towards the superior sagittal sinus.  Mild calcification right internal carotid artery cavernous segment without high-grade stenosis as questioned on MR.  Proximal basilar artery fenestration without saccular aneurysm   03-22-15   6.6 x 3.8 cm hematoma, resulting in 6 mm RIGHT to LEFT subfalcine herniation.  Intraventricular extension without hydrocephalus.  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    MRI HEAD 03/11/2015   Enlarging 7 x 4.4 cm RIGHT inferior frontal lobe intraparenchymal hematoma, worsening mass-effect with 12 mm RIGHT to LEFT subfalcine herniation.  Intraventricular extension of hemorrhage, new mild hydrocephalus.    MRA HEAD 03/14/2015   11 x 7 mm LEFT A1-2 junction aneurysm, cause of RIGHT frontal lobe hemorrhage.  Partially characterized RIGHT parietal AVM, and enlarged RIGHT middle cerebral feeding arteries. Large draining vein to the superior sagittal sinus seen on MRI.     Dg Chest Port 1 View 03/03/2015   1. Left IJ line noted ending about the distal SVC. 2. Mild left basilar airspace opacity likely reflects atelectasis. Lungs otherwise grossly clear.    2015/03/22    1. Endotracheal tube seen ending 4-5 cm above the carina.  2. Enteric tube noted extending below the diaphragm. 3. Vascular congestion, with increased interstitial markings, more prominent on the left, new from the recent prior study. This may reflect mildly asymmetric interstitial edema.     EEG abnormal electroencephalogram secondary to the decreased amplitude noted over the right hemisphere. This is consistent with the patient's history of a right ICH. No epileptiform activity is noted.     PHYSICAL EXAM Obese middle-age Caucasian male who is intubated   . Afebrile. Head is nontraumatic. Neck is supple without bruit.    Cardiac exam no murmur or gallop. Lungs are clear to auscultation. 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 Eyes are closed. Able to follow occasional commands on right side and move  extremities against gravity Right pupil 3 mm left to mimic the both sluggishly reactive. Doll's eye movements are present with slight right gaze preference. Does not blink to threat. Fundi were not visualized. No facial grimacing to painful stimuli.  Antigravity movements on right and minimal withdrawal to pain in left upper and left lower extremity. Deep tendon reflexes are 2+ symmetric plantars downgoing. ASSESSMENT/PLAN Mr. Dennis Anthony is a 49 y.o. male with history of bipolar disorder, polysubstance abuse presenting with possible seizure. CT in ED with R frontal ICH with 6mm right to left  Brain herniation. Intubated in the ED.   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  EEG pending   Transcranial Doppler  Ordered M, W, F  SCDs for VTE prophylaxis Diet NPO time specified  no antithrombotic prior to admission  Therapy recommendations:  pending   Disposition:  pending   Cytotoxic Cerebral Edema  Started on 3% - sodium at goal 153 today  Acute Respiratory Failure  Intubated in ED  Hypertension  Stable  Other Active Problems  bipoloar disorder/schizoaffective disorder  Hospital day # 4 I have personally examined this patient, reviewed notes, independently viewed imaging studies, participated in medical decision making and plan of care. I have made any additions or clarifications directly to the above note. Patient has presented with a seizure and unresponsiveness secondary to large right frontal mostly parenchymal hemorrhage secondary to complex bilobed anterior communicating artery aneurysm which is likely flow related due to   right parietal calcified AV malformation. Recommend strict control of blood pressure with goal systolic blood pressure below 409. Strict control of temperature with normothermia and glucose is euglycemia.  . Continue hypertonic saline to decrease cytotoxic edema with sodium goal 150-155.  Discussed with Dr. Alease Frame .Continue nimodipine for vasospasm prevention.Patient is at significant risk for neurological worsening with brain herniation, hydrocephalus and dying.This patient is critically ill and at significant risk of neurological worsening, death and care requires constant monitoring of vital signs, hemodynamics,respiratory and cardiac monitoring, extensive review of multiple databases, frequent neurological assessment, discussion with family, other specialists and medical decision making of  high complexity.I have made any additions or clarifications directly to the above note.This critical care time does not reflect procedure time, or teaching time or supervisory time of PA/NP/Med Resident etc but could involve care discussion time.  I spent 30 minutes of neurocritical care time  in the care of  this patient. After the patient's brother yesterday and the bedside and he stated that patient would not have wanted to live a life of disability and the bedridden and he is leaning towards withdrawal of ventilatory support and comfort care. I advised him to wait a few days and think over his decision as patient seems to be improving and is following commands  Delia Heady, MD   Delia Heady, MD Medical Director [facility] Pager: [phone] 02/27/2015 3:29 PM      To  contact Stroke Continuity provider, please refer to [email or web address] After hours, contact General Neurology

## 2015-02-27 NOTE — Progress Notes (Signed)
Pt transproted to and from CT without incident

## 2015-02-28 ENCOUNTER — Encounter (HOSPITAL_COMMUNITY): Payer: Self-pay

## 2015-02-28 ENCOUNTER — Inpatient Hospital Stay (HOSPITAL_COMMUNITY): Payer: Medicare HMO

## 2015-02-28 DIAGNOSIS — G935 Compression of brain: Secondary | ICD-10-CM

## 2015-02-28 DIAGNOSIS — R4020 Unspecified coma: Secondary | ICD-10-CM

## 2015-02-28 LAB — C DIFFICILE QUICK SCREEN W PCR REFLEX
C DIFFICILE (CDIFF) TOXIN: NEGATIVE
C Diff antigen: NEGATIVE
C Diff interpretation: NEGATIVE

## 2015-02-28 LAB — CBC
HCT: 43.3 % (ref 39.0–52.0)
Hemoglobin: 13.6 g/dL (ref 13.0–17.0)
MCH: 28.3 pg (ref 26.0–34.0)
MCHC: 31.4 g/dL (ref 30.0–36.0)
MCV: 90.2 fL (ref 78.0–100.0)
PLATELETS: 200 10*3/uL (ref 150–400)
RBC: 4.8 MIL/uL (ref 4.22–5.81)
RDW: 15.6 % — AB (ref 11.5–15.5)
WBC: 19.2 10*3/uL — AB (ref 4.0–10.5)

## 2015-02-28 LAB — POCT I-STAT 7, (LYTES, BLD GAS, ICA,H+H)
ACID-BASE DEFICIT: 5 mmol/L — AB (ref 0.0–2.0)
BICARBONATE: 21 meq/L (ref 20.0–24.0)
CALCIUM ION: 1.22 mmol/L (ref 1.12–1.23)
HEMATOCRIT: 41 % (ref 39.0–52.0)
Hemoglobin: 13.9 g/dL (ref 13.0–17.0)
O2 SAT: 100 %
PH ART: 7.327 — AB (ref 7.350–7.450)
Patient temperature: 37.2
Potassium: 4.1 mmol/L (ref 3.5–5.1)
SODIUM: 144 mmol/L (ref 135–145)
TCO2: 22 mmol/L (ref 0–100)
pCO2 arterial: 40.1 mmHg (ref 35.0–45.0)
pO2, Arterial: 342 mmHg — ABNORMAL HIGH (ref 80.0–100.0)

## 2015-02-28 LAB — GLUCOSE, CAPILLARY
GLUCOSE-CAPILLARY: 142 mg/dL — AB (ref 65–99)
GLUCOSE-CAPILLARY: 177 mg/dL — AB (ref 65–99)
GLUCOSE-CAPILLARY: 189 mg/dL — AB (ref 65–99)
GLUCOSE-CAPILLARY: 223 mg/dL — AB (ref 65–99)
Glucose-Capillary: 152 mg/dL — ABNORMAL HIGH (ref 65–99)
Glucose-Capillary: 231 mg/dL — ABNORMAL HIGH (ref 65–99)
Glucose-Capillary: 159 mg/dL — ABNORMAL HIGH (ref 65–99)

## 2015-02-28 LAB — BASIC METABOLIC PANEL
ANION GAP: 10 (ref 5–15)
BUN: 10 mg/dL (ref 6–20)
CALCIUM: 8.7 mg/dL — AB (ref 8.9–10.3)
CO2: 27 mmol/L (ref 22–32)
Chloride: 119 mmol/L — ABNORMAL HIGH (ref 101–111)
Creatinine, Ser: 0.65 mg/dL (ref 0.61–1.24)
GLUCOSE: 194 mg/dL — AB (ref 65–99)
Potassium: 2.7 mmol/L — CL (ref 3.5–5.1)
Sodium: 156 mmol/L — ABNORMAL HIGH (ref 135–145)

## 2015-02-28 LAB — BLOOD GAS, ARTERIAL
ACID-BASE DEFICIT: 0.9 mmol/L (ref 0.0–2.0)
Acid-Base Excess: 0.9 mmol/L (ref 0.0–2.0)
Bicarbonate: 24.2 mEq/L — ABNORMAL HIGH (ref 20.0–24.0)
Bicarbonate: 22.1 mEq/L (ref 20.0–24.0)
DRAWN BY: 41308
DRAWN BY: 301361
FIO2: 0.4
FIO2: 0.4
MECHVT: 560 mL
MECHVT: 560 mL
O2 SAT: 96.6 %
O2 Saturation: 95.7 %
PCO2 ART: 33.9 mmHg — AB (ref 35.0–45.0)
PEEP: 5 cmH2O
PEEP: 5 cmH2O
PH ART: 7.468 — AB (ref 7.350–7.450)
PO2 ART: 94.7 mmHg (ref 80.0–100.0)
Patient temperature: 98.6
Patient temperature: 101.3
RATE: 14 resp/min
RATE: 14 resp/min
TCO2: 25.2 mmol/L (ref 0–100)
TCO2: 23 mmol/L (ref 0–100)
pCO2 arterial: 31.6 mmHg — ABNORMAL LOW (ref 35.0–45.0)
pH, Arterial: 7.467 — ABNORMAL HIGH (ref 7.350–7.450)
pO2, Arterial: 85.8 mmHg (ref 80.0–100.0)

## 2015-02-28 LAB — SODIUM
SODIUM: 155 mmol/L — AB (ref 135–145)
SODIUM: 151 mmol/L — AB (ref 135–145)
Sodium: 146 mmol/L — ABNORMAL HIGH (ref 135–145)
Sodium: 155 mmol/L — ABNORMAL HIGH (ref 135–145)

## 2015-02-28 LAB — PHOSPHORUS: Phosphorus: 2.5 mg/dL (ref 2.5–4.6)

## 2015-02-28 LAB — MAGNESIUM: Magnesium: 2.1 mg/dL (ref 1.7–2.4)

## 2015-02-28 MED ORDER — SODIUM CHLORIDE 0.9 % IV SOLN
1.0000 mg/h | INTRAVENOUS | Status: DC
  Filled 2015-02-28: qty 10

## 2015-02-28 MED ORDER — NIMODIPINE 60 MG/20ML PO SOLN
30.0000 mg | ORAL | Status: DC
  Administered 2015-02-28 – 2015-03-02 (×23): 30 mg via ORAL
  Filled 2015-02-28 (×19): qty 20

## 2015-02-28 MED ORDER — IOHEXOL 350 MG/ML SOLN
100.0000 mL | Freq: Once | INTRAVENOUS | Status: AC | PRN
  Administered 2015-02-28: 100 mL via INTRAVENOUS

## 2015-02-28 MED ORDER — VITAL AF 1.2 CAL PO LIQD
1000.0000 mL | ORAL | Status: DC
  Administered 2015-02-28 – 2015-03-03 (×5): 1000 mL
  Filled 2015-02-28 (×9): qty 1000

## 2015-02-28 MED ORDER — PHENYLEPHRINE HCL 10 MG/ML IJ SOLN
30.0000 ug/min | INTRAVENOUS | Status: DC
  Administered 2015-02-28: 200 ug/min via INTRAVENOUS
  Administered 2015-02-28: 50 ug/min via INTRAVENOUS
  Administered 2015-02-28: 30 ug/min via INTRAVENOUS
  Administered 2015-02-28: 200 ug/min via INTRAVENOUS
  Administered 2015-03-01: 125 ug/min via INTRAVENOUS
  Administered 2015-03-01 (×3): 200 ug/min via INTRAVENOUS
  Administered 2015-03-01: 150 ug/min via INTRAVENOUS
  Administered 2015-03-01: 200 ug/min via INTRAVENOUS
  Filled 2015-02-28 (×9): qty 4

## 2015-02-28 MED ORDER — FENTANYL CITRATE (PF) 100 MCG/2ML IJ SOLN
50.0000 ug | INTRAMUSCULAR | Status: DC | PRN
  Administered 2015-02-28 (×2): 100 ug via INTRAVENOUS
  Administered 2015-02-28 (×2): 50 ug via INTRAVENOUS
  Administered 2015-03-01 – 2015-03-03 (×25): 100 ug via INTRAVENOUS
  Filled 2015-02-28 (×30): qty 2

## 2015-02-28 MED ORDER — IOHEXOL 350 MG/ML SOLN: 40.0000 mL | Freq: Once | INTRAVENOUS | Status: DC | PRN

## 2015-02-28 MED ORDER — POTASSIUM PHOSPHATES 15 MMOLE/5ML IV SOLN
30.0000 mmol | Freq: Once | INTRAVENOUS | Status: AC
  Administered 2015-02-28: 30 mmol via INTRAVENOUS
  Filled 2015-02-28: qty 10

## 2015-02-28 MED ORDER — SODIUM CHLORIDE 0.9 % IV BOLUS (SEPSIS)
1000.0000 mL | Freq: Once | INTRAVENOUS | Status: AC
  Administered 2015-02-28: 1000 mL via INTRAVENOUS

## 2015-02-28 MED ORDER — DEXTROSE 5 % IV SOLN
1.0000 g | Freq: Three times a day (TID) | INTRAVENOUS | Status: DC
  Administered 2015-02-28 – 2015-03-03 (×10): 1 g via INTRAVENOUS
  Filled 2015-02-28 (×12): qty 1

## 2015-02-28 NOTE — Progress Notes (Signed)
Dr. Conchita Paris aware of inability to achieve goal SBP 180 on current pressors at max. Target now MAP > 85 per order. 1L bolus infusing now, CVP 9. Will continue to monitor.

## 2015-02-28 NOTE — Progress Notes (Signed)
Patient was transported to CT and back to room 3M09 without any apparent complications. RT will continue to monitor.

## 2015-02-28 NOTE — Progress Notes (Signed)
Name: Dennis Anthony MRN: [id number] DOB: January 05, 1966    ADMISSION DATE:  03/22/2015 CONSULTATION DATE:  02/24/2015  REFERRING MD :  Dr Hosie Poisson  CHIEF COMPLAINT:  Acute resp failure  REASON FOR CONSULTATION: vent management  HISTORY OF PRESENT ILLNESS:  Patient is a 49 year old Caucasian male with past medical history significant for psychiatric issues including bipolar disorder schizoaffective disorder and polysubstance abuse who, per report, presents to emergency department at [facility] with complaints of a brief episode of loss of consciousness and shaking. There is question on whether or not this is seizure-like activity noted by EMS. CT of the head was performed which noted a 6.6 x 3.8 cm right inferior frontal lobe intraparenchymal hematoma with surrounding low-density vasogenic edema. Also noted to have a 6 mm right-to-left subfalcine herniation with a trace amount of subarachnoid hemorrhage. The patient was intubated for airway protection and transferred to our neuro ICU.   SUBJECTIVE:  Febrile overnight.   VITAL SIGNS: Temp:  [101.1 F (38.4 C)-104.4 F (40.2 C)] 102.2 F (39 C) (10/07 0943) Pulse Rate:  [83-142] 110 (10/07 0900) Resp:  [18-34] 27 (10/07 0900) BP: (111-165)/(60-109) 124/96 mmHg (10/07 0900) SpO2:  [94 %-100 %] 98 % (10/07 0900) FiO2 (%):  [40 %] 40 % (10/07 0900) Weight:  [87.5 kg (192 lb 14.4 oz)] 87.5 kg (192 lb 14.4 oz) (10/07 0418)  PHYSICAL EXAMINATION: General: Intubated, following some commands on the right. Neuro:  Withdrawing to pain. HEENT:  Moist mucus membranes Cardiovascular:  S1, S2, RRR, no MRG Lungs:  Clear, No wheeze or crackles. Abdomen:  + BS, soft, non tender, non distended. Skin:  Intact. Multiple tattos  Recent Labs Lab 02/26/15 0623  02/27/15 0550  02/27/15 1714 02/27/15 2344 02/28/15 0600  NA 153*  < > 151*  < > 155* 155* 156*  K 3.3*  --  3.0*  --   --   --  2.7*  CL 125*  --  121*  --   --   --  119*  CO2 21*  --  23  --    --   --  27  BUN 9  --  10  --   --   --  10  CREATININE 0.65  --  0.63  --   --   --  0.65  GLUCOSE 140*  --  233*  --   --   --  194*  < > = values in this interval not displayed.  Recent Labs Lab 02/26/15 0623 02/27/15 0550 02/28/15 0600  HGB 13.0 13.5 13.6  HCT 41.1 43.0 43.3  WBC 14.8* 15.4* 19.2*  PLT 173 174 200   ASSESSMENT / PLAN: Acute respiratory failure secondary to encephalopathy caused by the intraparenchymal hematoma with vasogenic edema..  PS but no extubation until Monday (one week post hemorrhage). Begin PS trials. Pan cultures negative except sputum with polymicrobia. CXR and ABG in AM. Continue TF. 3% saline off, per neuro. Cardene off. Norepi at 40 mcg to achieve triple H therapy. Neo started for target SBP of 180. D/C beta blockers. BMET in AM. Replace electrolytes as indicated (K and PO4 replaced).  Discussed with family, no trach/peg, will maintain intubated til day 7 which is Monday then likely one way extubation then.  Discussed with NS and neurology.  The patient is critically ill with multiple organ systems failure and requires high complexity decision making for assessment and support, frequent evaluation and titration of therapies, application of advanced monitoring technologies and  extensive interpretation of multiple databases.   Critical Care Time devoted to patient care services described in this note is  35  Minutes. This time reflects time of care of this signee Dr Koren Bound. This critical care time does not reflect procedure time, or teaching time or supervisory time of PA/NP/Med student/Med Resident etc but could involve care discussion time.  Alyson Reedy, M.D. [facility] Pulmonary/Critical Care Medicine. Pager: [phone]. After hours pager: [phone].  02/28/2015, 10:02 AM

## 2015-02-28 NOTE — Progress Notes (Signed)
ANTIBIOTIC CONSULT NOTE - INITIAL  Pharmacy Consult for Ceftazidime Indication: HCAP  No Known Allergies  Patient Measurements: Height:  (175.3 cm) Weight: 192 lb 14.4 oz (87.5 kg) IBW/kg (Calculated) : 70.7  Vital Signs: Temp: 101.2 F (38.4 C) (10/07 1000) Temp Source: Rectal (10/07 1000) BP: 145/92 mmHg (10/07 1000) Pulse Rate: 109 (10/07 1000) Intake/Output from previous day: 10/06 0701 - 10/07 0700 In: 5782.5 [I.V.:3932.5; NG/GT:1640; IV Piggyback:210] Out: 6070 [Urine:6070] Intake/Output from this shift: Total I/O In: 712.2 [I.V.:442.2; NG/GT:270] Out: 525 [Urine:525]  Labs:  Recent Labs  02/26/15 0623 02/27/15 0550 02/28/15 0600  WBC 14.8* 15.4* 19.2*  HGB 13.0 13.5 13.6  PLT 173 174 200  CREATININE 0.65 0.63 0.65   Estimated Creatinine Clearance: 122.3 mL/min (by C-G formula based on Cr of 0.65).  Medical History: Past Medical History  Diagnosis Date  . Bipolar disorder (HCC)   . Schizoaffective disorder (HCC)   . Polysubstance dependence (HCC)   . H/O suicide attempt June 2016    per brother pt jumped off back porch; wasn't taking his medications appropriately   Assessment: 49yom admitted with intraparenchymal hematoma now with GNR growing in his respiratory culture. He will begin ceftazidime. Renal function wnl.   10/7 Ceftazidime>> 10/5 urine>> negative 10/5 blood>> ngtd 10/5 resp>> GNR  Goal of Therapy:  Appropriate dosing  Plan:  1) Ceftazidime 1g IV q8 2) Follow up culture, renal function, LOT  Fredrik Rigger 02/28/2015,10:31 AM

## 2015-02-28 NOTE — Progress Notes (Addendum)
Transcranial Doppler  Date POD PCO2 HCT BP  MCA ACA PCA OPHT SIPH VERT Basilar  02/25/15 MS  34.8 41.4 172/78 Right  Left   154  32   -61  -39   -  -   35  29   -  -   -  -28     -25    02/26/15 MS  25.5 41.1 176/84 Right  Left   175 21     -39  -57   55 - -  -   -  -37     -54    02/28/15 MS  33.9 43.3 112/60 Right  Left   170  91   -32  -   54  -   53  32   -  -   -  -   -  -          Right  Left                                             Right  Left                                            Right  Left                                            Right  Left                                        MCA = Middle Cerebral Artery      OPHT = Opthalmic Artery     BASILAR = Basilar Artery   ACA = Anterior Cerebral Artery     SIPH = Carotid Siphon PCA = Posterior Cerebral Artery   VERT = Verterbral Artery                   Normal MCA = 62+\-12 ACA = 50+\-12 PCA = 42+\-23   -Unable to insonate  02/28/2015 3:06 PM Balinda Heacock, RVT, RDCS, RDMS

## 2015-02-28 NOTE — Progress Notes (Signed)
Pt seen and examined. No issues overnight.   EXAM: Temp:  [100.6 F (38.1 C)-104.4 F (40.2 C)] 100.6 F (38.1 C) (10/07 1130) Pulse Rate:  [83-142] 85 (10/07 1157) Resp:  [18-34] 23 (10/07 1157) BP: (118-165)/(65-109) 121/71 mmHg (10/07 1157) SpO2:  [94 %-100 %] 99 % (10/07 1157) FiO2 (%):  [40 %] 40 % (10/07 1157) Weight:  [87.5 kg (192 lb 14.4 oz)] 87.5 kg (192 lb 14.4 oz) (10/07 0418) Intake/Output      10/06 0701 - 10/07 0700 10/07 0701 - 10/08 0700   I.V. (mL/kg) 3932.5 (44.9) 442.2 (5.1)   NG/GT 1640 270   IV Piggyback 210    Total Intake(mL/kg) 5782.5 (66.1) 712.2 (8.1)   Urine (mL/kg/hr) 6070 (2.9) 525 (1.1)   Stool 0 (0) 0 (0)   Total Output 6070 525   Net -287.5 +187.2        Stool Occurrence 3 x 2 x    Opens eyes spontaneously Breathes spontaneously Follows commands briskly RUE/RLE No movement LUE/LLE  LABS: Lab Results  Component Value Date   CREATININE 0.65 02/28/2015   BUN 10 02/28/2015   NA 155* 02/28/2015   K 2.7* 02/28/2015   CL 119* 02/28/2015   CO2 27 02/28/2015   Lab Results  Component Value Date   WBC 19.2* 02/28/2015   HGB 13.6 02/28/2015   HCT 43.3 02/28/2015   MCV 90.2 02/28/2015   PLT 200 02/28/2015    IMPRESSION: - 49 y.o. male SAH d# 5 s/p clipping of Acom. Neurologically stable with Left hemiplegia - has had increased RMCA TCD velocities. Unclear if weakness is from hematoma v surgery v vasospasm - Febrile  PLAN: - Will get CTA/CTP thoday - Cont hyperdynamic therapy, total fluids 125cc/hr, Levo/Neo for SBP>150mmHg - started on empiric abx

## 2015-02-28 NOTE — Anesthesia Postprocedure Evaluation (Signed)
  Anesthesia Post-op Note  Patient: Dennis Anthony  Procedure(s) Performed: Procedure(s): RADIOLOGY WITH ANESTHESIA (N/A)  Patient Location: ICU  Anesthesia Type:General  Level of Consciousness: Patient remains intubated per anesthesia plan  Airway and Oxygen Therapy: Patient remains intubated per anesthesia plan and Patient placed on Ventilator (see vital sign flow sheet for setting)  Post-op Pain: unable to assess  Post-op Assessment: Post-op Vital signs reviewed and Patient's Cardiovascular Status Stable LLE Motor Response: No movement to painful stimulus, Other (Comment)   RLE Motor Response: Purposeful movement, Responds to commands        Post-op Vital Signs: Reviewed and stable  Last Vitals:  Filed Vitals:   02/28/15 0833  BP: 150/81  Pulse: 135  Temp:   Resp: 32    Complications: No apparent anesthesia complications

## 2015-02-28 NOTE — Progress Notes (Signed)
STROKE TEAM PROGRESS NOTE  Dennis Anthony is an 49 y.o. male hx of bipolar disorder, polysubstance abuse transferred from [facility] ED after presenting with AMS and questionable seizure episode. BP upon arrival was 156/110. CT head performed at that time (imaging reviewed) which showed a 6.6 x 3.8 cm right inferior frontal lobe ICH with 6mm right-to-left subfalcine herniation. Patient was intubated in the ED for airway protection. Prior to intubation, ED attending noted patient was minimally following commands and moving right side. ED attending (Dr Blinda Leatherwood) discussed case and reviewed images with neurosurgery (Dr Othelia Pulling) who did not feel that the patient required immediate surgical intervention.   Date last known well: 03-01-15 Time last known well: unclear tPA Given: no, ICH Modified Rankin: Rankin Score=0  ICH Score: 3 SUBJECTIVE (INTERVAL HISTORY)  Developed fever and resp distress. No more seizures witnessed. No family available at the bedside. Elevated WBC count Hypertonic saline continued and last Na 155. TCD showed elevated Rt MCA velocities s/o mild vasospasm.started on levophed to augment Bp and fluids. OBJECTIVE Temp:  [100.5 F (38.1 C)-104.4 F (40.2 C)] 100.5 F (38.1 C) (10/07 1245) Pulse Rate:  [79-142] 79 (10/07 1245) Cardiac Rhythm:  [-] Normal sinus rhythm (10/07 1245) Resp:  [18-34] 21 (10/07 1245) BP: (117-165)/(65-109) 132/80 mmHg (10/07 1245) SpO2:  [94 %-100 %] 100 % (10/07 1245) FiO2 (%):  [40 %] 40 % (10/07 1157) Weight:  [192 lb 14.4 oz (87.5 kg)] 192 lb 14.4 oz (87.5 kg) (10/07 0418)  CBC:   Recent Labs Lab 03/01/2015 2121  02/27/15 0550 02/28/15 0600  WBC 17.4*  < > 15.4* 19.2*  NEUTROABS 11.6*  --   --   --   HGB 16.5  < > 13.5 13.6  HCT 47.7  < > 43.0 43.3  MCV 88.7  < > 90.7 90.2  PLT 212  < > 174 200  < > = values in this interval not displayed.  Basic Metabolic Panel:  Recent Labs Lab 02/27/15 0550  02/28/15 0600 02/28/15 1145  NA 151*  < >  156* 155*  K 3.0*  --  2.7*  --   CL 121*  --  119*  --   CO2 23  --  27  --   GLUCOSE 233*  --  194*  --   BUN 10  --  10  --   CREATININE 0.63  --  0.65  --   CALCIUM 8.2*  --  8.7*  --   MG 2.2  --  2.1  --   PHOS 1.9*  --  2.5  --   < > = values in this interval not displayed.  Lipid Panel:     Component Value Date/Time   TRIG 138 02/27/2015 0550   HgbA1c: No results found for: HGBA1C Urine Drug Screen:     Component Value Date/Time   LABOPIA NONE DETECTED March 01, 2015 2200   COCAINSCRNUR NONE DETECTED 01-Mar-2015 2200   LABBENZ NONE DETECTED 01-Mar-2015 2200   AMPHETMU NONE DETECTED 03-01-15 2200   THCU NONE DETECTED 03-01-15 2200   LABBARB NONE DETECTED 2015/03/01 2200      IMAGING  Ct Angio Head W/cm &/or Wo Cm 03/23/2015   Right anterior frontal lobe hematoma has increased slightly in size now 7.2 x 4.4 cm versus prior 7 x 4.4 cm.  Midline shift to the left by 1.3 cm versus prior 1.2 cm.  Moderate to large amount of intraventricular blood.  Trapping of the temporal horns greater on the  left.  More evident is subarachnoid blood and/ or pseudo subarachnoid appearance from diffuse edema.  Calcification right parietal lobe consistent with underlying arteriovenous malformation.  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  Enlarged right middle cerebral artery branch supplying right parietal lobe larger arteriovenous malformation. Draining veins towards the superior sagittal sinus.  Mild calcification right internal carotid artery cavernous segment without high-grade stenosis as questioned on MR.  Proximal basilar artery fenestration without saccular aneurysm.     Dg Chest 2 View 03/10/2015   1. No acute cardiopulmonary process seen. 2. Multiple chronic left-sided rib deformities noted.     Ct Head Wo Contrast 2015/03/06   Right anterior frontal lobe hematoma has increased slightly in size now 7.2 x 4.4 cm versus prior 7 x 4.4 cm.  Midline shift to  the left by 1.3 cm versus prior 1.2 cm.  Moderate to large amount of intraventricular blood.  Trapping of the temporal horns greater on the left.  More evident is subarachnoid blood and/ or pseudo subarachnoid appearance from diffuse edema.  Calcification right parietal lobe consistent with underlying arteriovenous malformation.  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  Enlarged right middle cerebral artery branch supplying right parietal lobe larger arteriovenous malformation. Draining veins towards the superior sagittal sinus.  Mild calcification right internal carotid artery cavernous segment without high-grade stenosis as questioned on MR.  Proximal basilar artery fenestration without saccular aneurysm   03/18/2015   6.6 x 3.8 cm hematoma, resulting in 6 mm RIGHT to LEFT subfalcine herniation.  Intraventricular extension without hydrocephalus.  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    MRI HEAD 03/06/2015   Enlarging 7 x 4.4 cm RIGHT inferior frontal lobe intraparenchymal hematoma, worsening mass-effect with 12 mm RIGHT to LEFT subfalcine herniation.  Intraventricular extension of hemorrhage, new mild hydrocephalus.    MRA HEAD 03/06/15   11 x 7 mm LEFT A1-2 junction aneurysm, cause of RIGHT frontal lobe hemorrhage.  Partially characterized RIGHT parietal AVM, and enlarged RIGHT middle cerebral feeding arteries. Large draining vein to the superior sagittal sinus seen on MRI.     Dg Chest Port 1 View 03/06/2015   1. Left IJ line noted ending about the distal SVC. 2. Mild left basilar airspace opacity likely reflects atelectasis. Lungs otherwise grossly clear.    02/28/2015    1. Endotracheal tube seen ending 4-5 cm above the carina. 2. Enteric tube noted extending below the diaphragm. 3. Vascular congestion, with increased interstitial markings, more prominent on the left, new from the recent prior study. This may reflect mildly  asymmetric interstitial edema.     EEG abnormal electroencephalogram secondary to the decreased amplitude noted over the right hemisphere. This is consistent with the patient's history of a right ICH. No epileptiform activity is noted.     PHYSICAL EXAM Obese middle-age Caucasian male who is intubated   . Afebrile. Head is nontraumatic. Neck is supple without bruit.    Cardiac exam no murmur or gallop. Lungs are clear to auscultation. 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 Eyes are closed. Able to follow occasional commands on right side and move extremities against gravity Right pupil 3 mm left to mimic the both sluggishly reactive. Doll's eye movements are present with slight right gaze preference.  Does not blink to threat. Fundi were not visualized. No facial grimacing to painful stimuli.  Antigravity movements on right and minimal withdrawal to pain in left upper and left lower extremity. Deep tendon reflexes are 2+ symmetric plantars downgoing. ASSESSMENT/PLAN Mr. Dennis Anthony is a 49 y.o. male with history of bipolar disorder, polysubstance abuse presenting with possible seizure. CT in ED with R frontal ICH with 6mm right to left  Brain herniation. Intubated in the ED.   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  EEG pending   Transcranial Doppler  Ordered M, W, F  SCDs for VTE prophylaxis Diet NPO time specified  no antithrombotic prior to admission  Therapy recommendations:  pending   Disposition:  pending   Cytotoxic Cerebral Edema  Started on 3% - sodium at goal 155 today  Acute Respiratory Failure  Intubated in ED  Hypertension  Stable  Other Active Problems  bipoloar disorder/schizoaffective disorder  Hospital day # 5 I have personally examined this patient, reviewed notes, independently viewed imaging studies, participated in medical decision making and plan of care. I have made any additions or clarifications directly to the above note. Patient has presented with a seizure and unresponsiveness secondary to large right frontal mostly parenchymal hemorrhage secondary to complex bilobed anterior communicating artery aneurysm which is likely flow related due to   right parietal calcified AV malformation. Recommend strict control of blood pressure with goal systolic blood pressure below 213. Strict control of temperature with normothermia and glucose is euglycemia.  . Continue hypertonic saline to decrease cytotoxic edema with sodium goal 150-155.  Discussed with Dr. Alease Frame .Continue nimodipine for vasospasm prevention.Patient is at significant risk for neurological worsening with brain herniation, hydrocephalus and dying.This patient is critically ill and at significant risk of neurological worsening, death and care requires constant monitoring of vital signs, hemodynamics,respiratory and cardiac monitoring, extensive review of multiple databases, frequent neurological assessment, discussion with family, other specialists and medical decision making of high complexity.I have made any additions or clarifications directly to the above note.This critical care time does not reflect procedure time, or teaching time or supervisory time of PA/NP/Med  Resident etc but could involve care discussion time.  I spent 30 minutes of neurocritical care time  in the care of  this patient. After the patient's brother yesterday and the bedside and he stated that patient would not have wanted to live a life of disability and the bedridden and he is leaning towards withdrawal of ventilatory support and comfort care. I advised him to wait a few days and think over his decision as patient seems to be improving and is following commands  Delia Heady, MD   Delia Heady, MD Medical Director [facility] St[facility] Pager: [phone] 02/28/2015 1:47 PM      To contact Stroke Continuity provider, please refer to [email or web address]. After hours, contact General Neurology

## 2015-02-28 NOTE — Care Management Important Message (Signed)
Important Message  Patient Details  Name: Karmine Kauer MRN: [id number] Date of Birth: [birth year]   Medicare Important Message Given:  Yes-second notification given    Elliot Cousin, RN 02/28/2015, 2:11 PM

## 2015-02-28 NOTE — Progress Notes (Signed)
MD aware pt not at goal SBP despite max on levo & neo. Orders received. RT notified of CVP & ABG order.

## 2015-02-28 NOTE — Progress Notes (Signed)
CRITICAL VALUE ALERT  Critical value received:  Potassium 2.7  Date of notification:  02/28/2015  Time of notification:  07:30  Critical value read back:Yes.    Nurse who received alert:  B. Marjo Bicker RN  MD notified (1st page):  Dr. Molli Knock  Dr. Pearlean Brownie aware during rounds, defer to CCM  Time of first page:  07:30  Responding MD:  Dr. Molli Knock

## 2015-02-28 NOTE — Progress Notes (Signed)
Nutrition Follow-up  INTERVENTION:   Increase TF: Vital AF 1.2 @ 80 ml/hr Provides: 2304 kcal, 144 grams protein, and 1557 ml H2O.   NUTRITION DIAGNOSIS:   Inadequate oral intake related to inability to eat as evidenced by NPO status. Ongoing.   GOAL:   Patient will meet greater than or equal to 90% of their needs Met.   MONITOR:   Vent status, TF tolerance, Weight trends, Labs, I & O's   ASSESSMENT:   49 year old Caucasian male with past medical history significant for psychiatric issues including bipolar disorder schizoaffective disorder and polysubstance abuse who, per report, presents to ED at [facility] with complaints of a brief episode of loss of consciousness and shaking. CT of the head was performed which noted a 6.6 x 3.8 cm right inferior frontal lobe intraparenchymal hematoma with surrounding low-density vasogenic edema. Also noted to have a 6 mm right-to-left subfalcine herniation with a trace amount of subarachnoid hemorrhage. The patient was intubated for airway protection and transferred to our neuro ICU.   Patient is currently intubated on ventilator support MV: 15.5 L/min Temp (24hrs), Avg:102.2 F (39 C), Min:100.6 F (38.1 C), Max:104.4 F (40.2 C)  Pt with fevers. Per MD pt is following commands, recommends not withdrawing care at this time.  Labs: sodium elevated (3%), potassium low (2.7)  Diet Order:  Diet NPO time specified  Skin:  Reviewed, no issues  Last BM:  10/7  Height:   Ht Readings from Last 1 Encounters:  02/26/2015 _0  (1.753 m)    Weight:   Wt Readings from Last 1 Encounters:  02/28/15 192 lb 14.4 oz (87.5 kg)    Ideal Body Weight:  72.7 kg  BMI:  Body mass index is 28.47 kg/(m^2).  Estimated Nutritional Needs:   Kcal:  2428  Protein:  115-130 grams  Fluid:  > 2 L/day  EDUCATION NEEDS:   No education needs identified at this time  [place], [place], [place] Pager [phone] After Hours Pager

## 2015-02-28 NOTE — Progress Notes (Signed)
eLink Physician-Brief Progress Note Patient Name: Dennis Anthony DOB: 03/20/1966 MRN: [id number]   Date of Service  02/28/2015  HPI/Events of Note  Not at SBP goal of 180 in spite of Norepinephrine and Phenylephrine IV infusions at maximums. Has not been at goal SBP for the last 2 days.  eICU Interventions  Will order: 1. Monitor CVP. 2. Bolus with 0.9 NaCl 1 liter IV over 1 hour now.  3. ABG now. 4. Asked Nurse to consult with Neurosurgery to see if goal SBP can be lowered.     Intervention Category Major Interventions: Hypotension - evaluation and management  Allea Kassner Eugene 02/28/2015, 3:52 PM

## 2015-03-01 ENCOUNTER — Inpatient Hospital Stay (HOSPITAL_COMMUNITY): Payer: Medicare HMO

## 2015-03-01 LAB — POCT I-STAT 3, ART BLOOD GAS (G3+)
Acid-Base Excess: 2 mmol/L (ref 0.0–2.0)
Bicarbonate: 26.9 mEq/L — ABNORMAL HIGH (ref 20.0–24.0)
O2 Saturation: 98 %
TCO2: 28 mmol/L (ref 0–100)
pCO2 arterial: 42 mmHg (ref 35.0–45.0)
pH, Arterial: 7.419 (ref 7.350–7.450)
pO2, Arterial: 100 mmHg (ref 80.0–100.0)

## 2015-03-01 LAB — CBC
HEMATOCRIT: 45.4 % (ref 39.0–52.0)
Hemoglobin: 14.4 g/dL (ref 13.0–17.0)
MCH: 28.3 pg (ref 26.0–34.0)
MCHC: 31.7 g/dL (ref 30.0–36.0)
MCV: 89.2 fL (ref 78.0–100.0)
Platelets: 191 10*3/uL (ref 150–400)
RBC: 5.09 MIL/uL (ref 4.22–5.81)
RDW: 15.5 % (ref 11.5–15.5)
WBC: 21 10*3/uL — AB (ref 4.0–10.5)

## 2015-03-01 LAB — PHOSPHORUS: Phosphorus: 3 mg/dL (ref 2.5–4.6)

## 2015-03-01 LAB — SODIUM
SODIUM: 148 mmol/L — AB (ref 135–145)
SODIUM: 147 mmol/L — AB (ref 135–145)

## 2015-03-01 LAB — GLUCOSE, CAPILLARY
GLUCOSE-CAPILLARY: 221 mg/dL — AB (ref 65–99)
GLUCOSE-CAPILLARY: 213 mg/dL — AB (ref 65–99)
GLUCOSE-CAPILLARY: 130 mg/dL — AB (ref 65–99)
GLUCOSE-CAPILLARY: 172 mg/dL — AB (ref 65–99)
Glucose-Capillary: 156 mg/dL — ABNORMAL HIGH (ref 65–99)

## 2015-03-01 LAB — BASIC METABOLIC PANEL
ANION GAP: 9 (ref 5–15)
BUN: 9 mg/dL (ref 6–20)
CALCIUM: 8.7 mg/dL — AB (ref 8.9–10.3)
CO2: 27 mmol/L (ref 22–32)
Chloride: 112 mmol/L — ABNORMAL HIGH (ref 101–111)
Creatinine, Ser: 0.59 mg/dL — ABNORMAL LOW (ref 0.61–1.24)
GFR calc Af Amer: >60 mL/min (ref >60)
GFR calc non Af Amer: >60 mL/min (ref >60)
GLUCOSE: 243 mg/dL — AB (ref 65–99)
Potassium: 3.2 mmol/L — ABNORMAL LOW (ref 3.5–5.1)
Sodium: 148 mmol/L — ABNORMAL HIGH (ref 135–145)

## 2015-03-01 LAB — MAGNESIUM: Magnesium: 2.2 mg/dL (ref 1.7–2.4)

## 2015-03-01 MED ORDER — ANTISEPTIC ORAL RINSE SOLUTION (CORINZ)
7.0000 mL | OROMUCOSAL | Status: DC
  Administered 2015-03-01 – 2015-03-03 (×15): 7 mL via OROMUCOSAL

## 2015-03-01 MED ORDER — POTASSIUM CHLORIDE 20 MEQ/15ML (10%) PO SOLN
40.0000 meq | Freq: Three times a day (TID) | ORAL | Status: AC
  Administered 2015-03-01 (×2): 40 meq
  Filled 2015-03-01 (×2): qty 30

## 2015-03-01 MED ORDER — SODIUM CHLORIDE 3 % IV SOLN
INTRAVENOUS | Status: DC
  Filled 2015-03-01 (×5): qty 500

## 2015-03-01 MED ORDER — ANTISEPTIC ORAL RINSE SOLUTION (CORINZ)
7.0000 mL | Freq: Four times a day (QID) | OROMUCOSAL | Status: DC
  Administered 2015-03-01 (×2): 7 mL via OROMUCOSAL

## 2015-03-01 MED ORDER — CHLORHEXIDINE GLUCONATE 0.12% ORAL RINSE (MEDLINE KIT)
15.0000 mL | Freq: Two times a day (BID) | OROMUCOSAL | Status: DC
  Administered 2015-03-02 – 2015-03-03 (×3): 15 mL via OROMUCOSAL

## 2015-03-01 NOTE — Progress Notes (Signed)
Physician aware of blood pressure.

## 2015-03-01 NOTE — Progress Notes (Signed)
Name: Dennis Anthony MRN: [id number] DOB: August 06, 1965    ADMISSION DATE:  03/07/2015 CONSULTATION DATE:  2015/03/25  REFERRING MD :  Dr Hosie Poisson  CHIEF COMPLAINT:  Acute resp failure  REASON FOR CONSULTATION: vent management  HISTORY OF PRESENT ILLNESS:  Patient is a 49 year old Caucasian male with past medical history significant for psychiatric issues including bipolar disorder schizoaffective disorder and polysubstance abuse who, per report, presents to emergency department at [facility] with complaints of a brief episode of loss of consciousness and shaking. There is question on whether or not this is seizure-like activity noted by EMS. CT of the head was performed which noted a 6.6 x 3.8 cm right inferior frontal lobe intraparenchymal hematoma with surrounding low-density vasogenic edema. Also noted to have a 6 mm right-to-left subfalcine herniation with a trace amount of subarachnoid hemorrhage. The patient was intubated for airway protection and transferred to our neuro ICU.   SUBJECTIVE:  Febrile overnight.  Awake and following commands.   VITAL SIGNS: Temp:  [98.9 F (37.2 C)-101.5 F (38.6 C)] 100.4 F (38 C) (10/08 0800) Pulse Rate:  [77-124] 107 (10/08 1130) Resp:  [14-35] 28 (10/08 1130) BP: (105-184)/(54-110) 130/70 mmHg (10/08 1130) SpO2:  [95 %-100 %] 97 % (10/08 1130) FiO2 (%):  [40 %] 40 % (10/08 1127) Weight:  [84.4 kg (186 lb 1.1 oz)] 84.4 kg (186 lb 1.1 oz) (10/08 0400)  PHYSICAL EXAMINATION: General: Intubated, following commands on the right. Neuro:  Shook hands with me this AM, moving right not left. HEENT:  Moist mucus membranes. Cardiovascular:  S1, S2, RRR, no MRG. Lungs:  Clear, No wheeze or crackles. Abdomen:  + BS, soft, non tender, non distended. Skin:  Intact. Multiple tattos  Recent Labs Lab 02/27/15 0550  02/28/15 0600  02/28/15 2245 03/01/15 0445 03/01/15 1105  NA 151*  < > 156*  < > 146* 148* 148*  K 3.0*  --  2.7*  --   --  3.2*  --   CL  121*  --  119*  --   --  112*  --   CO2 23  --  27  --   --  27  --   BUN 10  --  10  --   --  9  --   CREATININE 0.63  --  0.65  --   --  0.59*  --   GLUCOSE 233*  --  194*  --   --  243*  --   < > = values in this interval not displayed.  Recent Labs Lab 02/27/15 0550 02/28/15 0600 03/01/15 0445  HGB 13.5 13.6 14.4  HCT 43.0 43.3 45.4  WBC 15.4* 19.2* 21.0*  PLT 174 200 191   ASSESSMENT / PLAN: Acute respiratory failure secondary to encephalopathy caused by the intraparenchymal hematoma with vasogenic edema..  PS but no extubation until Monday (one week post hemorrhage). PS trials but no extubation. One way extubation likely tomorrow or Monday. Pan cultures negative except sputum with polymicrobia. CXR and ABG in AM. Continue TF. 3% saline off, per neuro. Cardene off. Norepi at 40 mcg to achieve triple H therapy. Neo at max. D/C beta blockers. BMET in AM. Replace electrolytes as indicated.  Discussed with family, no trach/peg, will maintain intubated til day 7 which is Monday then likely one way extubation then.  Discussed with NS and neurology.  The patient is critically ill with multiple organ systems failure and requires high complexity decision making for assessment and support,  frequent evaluation and titration of therapies, application of advanced monitoring technologies and extensive interpretation of multiple databases.   Critical Care Time devoted to patient care services described in this note is  35  Minutes. This time reflects time of care of this signee Dr Koren Bound. This critical care time does not reflect procedure time, or teaching time or supervisory time of PA/NP/Med student/Med Resident etc but could involve care discussion time.  Alyson Reedy, M.D. [facility] Pulmonary/Critical Care Medicine. Pager: [phone]. After hours pager: [phone].  03/01/2015, 11:47 AM

## 2015-03-01 NOTE — Progress Notes (Signed)
Pt seen and examined. No issues overnight.   EXAM: Temp:  [98.9 F (37.2 C)-101.5 F (38.6 C)] 100.4 F (38 C) (10/08 0515) Pulse Rate:  [77-124] 99 (10/08 0900) Resp:  [14-35] 24 (10/08 0900) BP: (105-184)/(54-110) 137/70 mmHg (10/08 0900) SpO2:  [95 %-100 %] 100 % (10/08 0900) FiO2 (%):  [40 %] 40 % (10/08 0900) Weight:  [84.4 kg (186 lb 1.1 oz)] 84.4 kg (186 lb 1.1 oz) (10/08 0400) Intake/Output      10/07 0701 - 10/08 0700 10/08 0701 - 10/09 0700   I.V. (mL/kg) 3190.2 (37.8) 417.5 (4.9)   NG/GT 2200 240   IV Piggyback 1905    Total Intake(mL/kg) 7295.2 (86.4) 657.5 (7.8)   Urine (mL/kg/hr) 5145 (2.5) 325 (1.1)   Stool 1 (0)    Total Output 5146 325   Net +2149.2 +332.5        Stool Occurrence 2 x     Opens eyes to voice Breathes spontaneously  Follows commands briskly RUE/RLE No movement on Left  LABS: Lab Results  Component Value Date   CREATININE 0.59* 03/01/2015   BUN 9 03/01/2015   NA 148* 03/01/2015   K 3.2* 03/01/2015   CL 112* 03/01/2015   CO2 27 03/01/2015   Lab Results  Component Value Date   WBC 21.0* 03/01/2015   HGB 14.4 03/01/2015   HCT 45.4 03/01/2015   MCV 89.2 03/01/2015   PLT 191 03/01/2015    IMAGING: CTA reviewed, demonstrates perhaps mild A1 narrowing, otherwise essentially normal caliber vessels. CTP was not reconstructed  IMPRESSION: - 49 y.o. male SAH d# 6 s/p Acom clipping, unchanged left hemiplegia - Febrile, on prophylactic abx  PLAN: - Cont IVF hydration, maxed on levophed/neo, SBP and MAP unable to reliably maintain goal SBP 180 or MAP > 85. Will attempt to wean pressors since they don't appear to be having a measurable effect. - Cont nimotop/TCD - Wean as tolerated - Plan on extubation with family on Mon. They have indicated Mr. Ransome would not want trach/PEG.

## 2015-03-01 NOTE — Progress Notes (Signed)
STROKE TEAM PROGRESS NOTE  Dennis Anthony is an 49 y.o. male hx of bipolar disorder, polysubstance abuse transferred from [facility] ED after presenting with AMS and questionable seizure episode. BP upon arrival was 156/110. CT head performed at that time (imaging reviewed) which showed a 6.6 x 3.8 cm right inferior frontal lobe ICH with 6mm right-to-left subfalcine herniation. Patient was intubated in the ED for airway protection. Prior to intubation, ED attending noted patient was minimally following commands and moving right side. ED attending (Dr Blinda Leatherwood) discussed case and reviewed images with neurosurgery (Dr Othelia Pulling) who did not feel that the patient required immediate surgical intervention.   Date last known well: 03/02/2015 Time last known well: unclear tPA Given: no, ICH Modified Rankin: Rankin Score=0  ICH Score: 3 SUBJECTIVE (INTERVAL HISTORY)   TCD showed elevated Rt MCA velocities s/o mild vasospasm.only warfarin/knee 04 blood pressure augmentation. Sodium is 148. CT Angio shows stable appearance of the hematoma and CT perfusion study is suboptimal due to software malfunction OBJECTIVE Temp:  [98.9 F (37.2 C)-101.5 F (38.6 C)] 100.4 F (38 C) (10/08 0800) Pulse Rate:  [77-124] 107 (10/08 1130) Cardiac Rhythm:  [-] Sinus tachycardia (10/08 0800) Resp:  [14-35] 28 (10/08 1130) BP: (105-184)/(54-110) 130/70 mmHg (10/08 1130) SpO2:  [95 %-100 %] 97 % (10/08 1130) FiO2 (%):  [40 %] 40 % (10/08 1127) Weight:  [186 lb 1.1 oz (84.4 kg)] 186 lb 1.1 oz (84.4 kg) (10/08 0400)  CBC:   Recent Labs Lab 02/28/2015 2121  02/28/15 0600 03/01/15 0445  WBC 17.4*  < > 19.2* 21.0*  NEUTROABS 11.6*  --   --   --   HGB 16.5  < > 13.6 14.4  HCT 47.7  < > 43.3 45.4  MCV 88.7  < > 90.2 89.2  PLT 212  < > 200 191  < > = values in this interval not displayed.  Basic Metabolic Panel:  Recent Labs Lab 02/28/15 0600  03/01/15 0445 03/01/15 1105  NA 156*  < > 148* 148*  K 2.7*  --  3.2*  --    CL 119*  --  112*  --   CO2 27  --  27  --   GLUCOSE 194*  --  243*  --   BUN 10  --  9  --   CREATININE 0.65  --  0.59*  --   CALCIUM 8.7*  --  8.7*  --   MG 2.1  --  2.2  --   PHOS 2.5  --  3.0  --   < > = values in this interval not displayed.  Lipid Panel:     Component Value Date/Time   TRIG 138 02/27/2015 0550   HgbA1c: No results found for: HGBA1C Urine Drug Screen:     Component Value Date/Time   LABOPIA NONE DETECTED 03/18/2015 2200   COCAINSCRNUR NONE DETECTED 02/28/2015 2200   LABBENZ NONE DETECTED 03/19/2015 2200   AMPHETMU NONE DETECTED 03/16/2015 2200   THCU NONE DETECTED 03/02/2015 2200   LABBARB NONE DETECTED 03/10/2015 2200      IMAGING  Ct Angio Head W/cm &/or Wo Cm 03/19/2015   Right anterior frontal lobe hematoma has increased slightly in size now 7.2 x 4.4 cm versus prior 7 x 4.4 cm.  Midline shift to the left by 1.3 cm versus prior 1.2 cm.  Moderate to large amount of intraventricular blood.  Trapping of the temporal horns greater on the left.  More evident is subarachnoid  blood and/ or pseudo subarachnoid appearance from diffuse edema.  Calcification right parietal lobe consistent with underlying arteriovenous malformation.  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  Enlarged right middle cerebral artery branch supplying right parietal lobe larger arteriovenous malformation. Draining veins towards the superior sagittal sinus.  Mild calcification right internal carotid artery cavernous segment without high-grade stenosis as questioned on MR.  Proximal basilar artery fenestration without saccular aneurysm.     Dg Chest 2 View 03/16/2015   1. No acute cardiopulmonary process seen. 2. Multiple chronic left-sided rib deformities noted.     Ct Head Wo Contrast 02/22/2015   Right anterior frontal lobe hematoma has increased slightly in size now 7.2 x 4.4 cm versus prior 7 x 4.4 cm.  Midline shift to the left by 1.3 cm versus prior  1.2 cm.  Moderate to large amount of intraventricular blood.  Trapping of the temporal horns greater on the left.  More evident is subarachnoid blood and/ or pseudo subarachnoid appearance from diffuse edema.  Calcification right parietal lobe consistent with underlying arteriovenous malformation.  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  Enlarged right middle cerebral artery branch supplying right parietal lobe larger arteriovenous malformation. Draining veins towards the superior sagittal sinus.  Mild calcification right internal carotid artery cavernous segment without high-grade stenosis as questioned on MR.  Proximal basilar artery fenestration without saccular aneurysm   03/13/2015   6.6 x 3.8 cm hematoma, resulting in 6 mm RIGHT to LEFT subfalcine herniation.  Intraventricular extension without hydrocephalus.  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    MRI HEAD 03/10/2015   Enlarging 7 x 4.4 cm RIGHT inferior frontal lobe intraparenchymal hematoma, worsening mass-effect with 12 mm RIGHT to LEFT subfalcine herniation.  Intraventricular extension of hemorrhage, new mild hydrocephalus.    MRA HEAD 03/08/2015   11 x 7 mm LEFT A1-2 junction aneurysm, cause of RIGHT frontal lobe hemorrhage.  Partially characterized RIGHT parietal AVM, and enlarged RIGHT middle cerebral feeding arteries. Large draining vein to the superior sagittal sinus seen on MRI.     Dg Chest Port 1 View 03/03/2015   1. Left IJ line noted ending about the distal SVC. 2. Mild left basilar airspace opacity likely reflects atelectasis. Lungs otherwise grossly clear.    03/24/2015    1. Endotracheal tube seen ending 4-5 cm above the carina. 2. Enteric tube noted extending below the diaphragm. 3. Vascular congestion, with increased interstitial markings, more prominent on the left, new from the recent prior study. This may reflect mildly asymmetric interstitial edema.      EEG abnormal electroencephalogram secondary to the decreased amplitude noted over the right hemisphere. This is consistent with the patient's history of a right ICH. No epileptiform activity is noted.     PHYSICAL EXAM Obese middle-age Caucasian male who is intubated   . Afebrile. Head is nontraumatic. Neck is supple without bruit.    Cardiac exam no murmur or gallop. Lungs are clear to auscultation. 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 Eyes are closed. Able to follow occasional commands on right side and move extremities against gravity Right pupil 3 mm left to mimic the both sluggishly reactive. Doll's eye movements are present with slight right gaze preference. Does not blink to threat.  Fundi were not visualized. No facial grimacing to painful stimuli.  Antigravity movements on right and minimal withdrawal to pain in left upper and left lower extremity. Deep tendon reflexes are 2+ symmetric plantars downgoing. ASSESSMENT/PLAN Mr. Dennis Anthony is a 49 y.o. male with history of bipolar disorder, polysubstance abuse presenting with possible seizure. CT in ED with R frontal ICH with 6mm right to left  Brain herniation. Intubated in the ED.   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 Patient also has right parietal calcified AVM which does not appear to have bled and patient's aneurysms may be flow related s/o frontal craniotomy with clot evacuation and surgical clipping of ACoM aneurysm on 03/22/2015  Resultant  comatose state with left hemiplegia  MRI  Enlarging R inferior frontal lobe IPH, with worsening mass effect w/ 12 mm R to L subfalcine herniation  MRA  L A1-2 R junction aneurysm,  RIGHT parietal AVM with enlarged RIGHT middle cerebral feeding  arteries. EEG  decreased amplitude noted over the right hemisphere. This is consistent with the patient's history of a right ICH. No epileptiform activity is noted.   Transcranial Doppler  Ordered M, W, F  SCDs for VTE prophylaxis Diet NPO time specified  no antithrombotic prior to admission  Therapy recommendations:  pending   Disposition:  pending   Cytotoxic Cerebral Edema  Started on 3% - sodium at goal 155 today  Acute Respiratory Failure  Intubated in ED  Hypertension  Stable  Other Active Problems  bipoloar disorder/schizoaffective disorder  Hospital day # 6 I have personally examined this patient, reviewed notes, independently viewed imaging studies, participated in medical decision making and plan of care. I have made any additions or clarifications directly to the above note. Patient has presented with a seizure and unresponsiveness secondary to large right frontal mostly parenchymal hemorrhage secondary to complex bilobed anterior communicating artery aneurysm which is likely flow related due to   right parietal calcified AV malformation. Recommend strict control of blood pressure with goal systolic blood pressure below 161. Strict control of temperature with normothermia and glucose is euglycemia.  . Taper hypertonic saline  And dc over next 24 hrs..  Discussed with Dr. Alease Frame .Continue nimodipine for vasospasm prevention. Patient's family wanted extubation on Monday but no tracheostomy or PEG tube .This patient is critically ill and at significant risk of neurological worsening, death and care requires constant monitoring of vital signs, hemodynamics,respiratory and cardiac monitoring, extensive review of multiple databases, frequent neurological assessment, discussion with family, other specialists and medical decision making of high complexity.I have made any additions or clarifications directly to the above note.This critical care time does not reflect procedure  time, or teaching time or supervisory time of PA/NP/Med Resident etc but could involve care discussion time.  I spent 30 minutes of neurocritical care time  in the care of  this patient.   Delia Heady, MD   Delia Heady, MD Medical Director [facility] Pager: [phone] 03/01/2015 11:50 AM      To contact Stroke Continuity provider, please refer to [email or web address]. After hours, contact General Neurology

## 2015-03-02 ENCOUNTER — Inpatient Hospital Stay (HOSPITAL_COMMUNITY): Payer: Medicare HMO

## 2015-03-02 DIAGNOSIS — Q2730 Arteriovenous malformation, site unspecified: Secondary | ICD-10-CM

## 2015-03-02 LAB — SODIUM
SODIUM: 147 mmol/L — AB (ref 135–145)
SODIUM: 149 mmol/L — AB (ref 135–145)
SODIUM: 146 mmol/L — AB (ref 135–145)
Sodium: 146 mmol/L — ABNORMAL HIGH (ref 135–145)

## 2015-03-02 LAB — GLUCOSE, CAPILLARY
GLUCOSE-CAPILLARY: 119 mg/dL — AB (ref 65–99)
GLUCOSE-CAPILLARY: 118 mg/dL — AB (ref 65–99)
GLUCOSE-CAPILLARY: 153 mg/dL — AB (ref 65–99)
GLUCOSE-CAPILLARY: 97 mg/dL (ref 65–99)
Glucose-Capillary: 135 mg/dL — ABNORMAL HIGH (ref 65–99)
Glucose-Capillary: 131 mg/dL — ABNORMAL HIGH (ref 65–99)
Glucose-Capillary: 145 mg/dL — ABNORMAL HIGH (ref 65–99)

## 2015-03-02 LAB — BASIC METABOLIC PANEL
ANION GAP: 9 (ref 5–15)
BUN: 10 mg/dL (ref 6–20)
CHLORIDE: 113 mmol/L — AB (ref 101–111)
CO2: 27 mmol/L (ref 22–32)
CREATININE: 0.54 mg/dL — AB (ref 0.61–1.24)
Calcium: 8.8 mg/dL — ABNORMAL LOW (ref 8.9–10.3)
GFR calc non Af Amer: >60 mL/min (ref >60)
Glucose, Bld: 149 mg/dL — ABNORMAL HIGH (ref 65–99)
POTASSIUM: 3.6 mmol/L (ref 3.5–5.1)
SODIUM: 149 mmol/L — AB (ref 135–145)

## 2015-03-02 LAB — CBC
HEMATOCRIT: 42 % (ref 39.0–52.0)
HEMOGLOBIN: 13.3 g/dL (ref 13.0–17.0)
MCH: 28.4 pg (ref 26.0–34.0)
MCHC: 31.7 g/dL (ref 30.0–36.0)
MCV: 89.7 fL (ref 78.0–100.0)
Platelets: 165 10*3/uL (ref 150–400)
RBC: 4.68 MIL/uL (ref 4.22–5.81)
RDW: 15.9 % — ABNORMAL HIGH (ref 11.5–15.5)
WBC: 17.5 10*3/uL — ABNORMAL HIGH (ref 4.0–10.5)

## 2015-03-02 LAB — PHOSPHORUS: PHOSPHORUS: 3.3 mg/dL (ref 2.5–4.6)

## 2015-03-02 LAB — POCT I-STAT 3, ART BLOOD GAS (G3+)
BICARBONATE: 24.3 meq/L — AB (ref 20.0–24.0)
O2 Saturation: 98 %
PO2 ART: 95 mmHg (ref 80.0–100.0)
Patient temperature: 97.4
TCO2: 25 mmol/L (ref 0–100)
pCO2 arterial: 36.2 mmHg (ref 35.0–45.0)
pH, Arterial: 7.432 (ref 7.350–7.450)

## 2015-03-02 LAB — TRIGLYCERIDES: TRIGLYCERIDES: 123 mg/dL (ref <150)

## 2015-03-02 LAB — MAGNESIUM: MAGNESIUM: 2.3 mg/dL (ref 1.7–2.4)

## 2015-03-02 MED ORDER — DEXMEDETOMIDINE HCL IN NACL 200 MCG/50ML IV SOLN
0.4000 ug/kg/h | INTRAVENOUS | Status: DC
  Administered 2015-03-02 (×2): 0.4 ug/kg/h via INTRAVENOUS
  Administered 2015-03-02: 0.6 ug/kg/h via INTRAVENOUS
  Administered 2015-03-02: 0.4 ug/kg/h via INTRAVENOUS
  Filled 2015-03-02 (×4): qty 50

## 2015-03-02 MED ORDER — NIMODIPINE 60 MG/20ML PO SOLN
60.0000 mg | ORAL | Status: DC
  Administered 2015-03-02 – 2015-03-03 (×4): 60 mg via ORAL
  Filled 2015-03-02 (×5): qty 20

## 2015-03-02 MED ORDER — SODIUM CHLORIDE 0.9 % IV BOLUS (SEPSIS)
500.0000 mL | Freq: Once | INTRAVENOUS | Status: AC
  Administered 2015-03-02: 500 mL via INTRAVENOUS

## 2015-03-02 NOTE — Progress Notes (Signed)
STROKE TEAM PROGRESS NOTE   SUBJECTIVE (INTERVAL HISTORY) No family at bedisde. TCD showed elevated Rt MCA velocities Friday, c/w mild vasospasm. Off levophed, BP 140s. Sodium is 149. Following commands, still on vent, not able to wean off. As per NSG, family would like extubate on Monday but on trach or PEG.  OBJECTIVE Temp:  [97.6 F (36.4 C)-102 F (38.9 C)] 97.8 F (36.6 C) (10/09 0545) Pulse Rate:  [72-131] 103 (10/09 0900) Cardiac Rhythm:  [-] Sinus tachycardia (10/09 0800) Resp:  [17-38] 22 (10/09 0900) BP: (78-160)/(18-144) 144/81 mmHg (10/09 0900) SpO2:  [95 %-100 %] 99 % (10/09 0900) FiO2 (%):  [40 %] 40 % (10/09 0814) Weight:  [186 lb 1.1 oz (84.4 kg)] 186 lb 1.1 oz (84.4 kg) (10/09 0400)  CBC:   Recent Labs Lab 03-25-2015 2121  03/01/15 0445 03/02/15 0415  WBC 17.4*  < > 21.0* 17.5*  NEUTROABS 11.6*  --   --   --   HGB 16.5  < > 14.4 13.3  HCT 47.7  < > 45.4 42.0  MCV 88.7  < > 89.2 89.7  PLT 212  < > 191 165  < > = values in this interval not displayed.  Basic Metabolic Panel:  Recent Labs Lab 03/01/15 0445  03/01/15 2315 03/02/15 0415  NA 148*  < > 146* 149*  K 3.2*  --   --  3.6  CL 112*  --   --  113*  CO2 27  --   --  27  GLUCOSE 243*  --   --  149*  BUN 9  --   --  10  CREATININE 0.59*  --   --  0.54*  CALCIUM 8.7*  --   --  8.8*  MG 2.2  --   --  2.3  PHOS 3.0  --   --  3.3  < > = values in this interval not displayed.  Lipid Panel:     Component Value Date/Time   TRIG 123 03/02/2015 0415   HgbA1c: No results found for: HGBA1C Urine Drug Screen:     Component Value Date/Time   LABOPIA NONE DETECTED March 25, 2015 2200   COCAINSCRNUR NONE DETECTED 25-Mar-2015 2200   LABBENZ NONE DETECTED 2015/03/25 2200   AMPHETMU NONE DETECTED Mar 25, 2015 2200   THCU NONE DETECTED 03-25-2015 2200   LABBARB NONE DETECTED 03/25/2015 2200      IMAGING  Ct Angio Head W/cm &/or Wo Cm  02/28/2015   IMPRESSION: 1. Sequelae of recent anterior communicating  artery aneurysm clipping. Small residual aneurysm base measuring 5 x 3 mm as above. 2. No evidence of significant intracranial arterial stenosis. Mild narrowing of both A1 segments and minimal bilateral M2 narrowing. 3. Right frontal parenchymal hematoma with surrounding edema, not significantly changed from yesterday's head CT. 4. Small volume intraventricular hemorrhage, slightly decreased in the interim. 5. Slightly increased size of low-density left subdural fluid collection suggestive of hygroma. 6. Incomplete CT perfusion processing at this time due to technical reasons. No clearly significant abnormal perfusion on the limited data available at this time. Full analysis will be performed when software errors are corrected.    03/20/2015   Right anterior frontal lobe hematoma has increased slightly in size now 7.2 x 4.4 cm versus prior 7 x 4.4 cm.  Midline shift to the left by 1.3 cm versus prior 1.2 cm.  Moderate to large amount of intraventricular blood.  Trapping of the temporal horns greater on the left.  More evident  is subarachnoid blood and/ or pseudo subarachnoid appearance from diffuse edema.  Calcification right parietal lobe consistent with underlying arteriovenous malformation.  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  Enlarged right middle cerebral artery branch supplying right parietal lobe larger arteriovenous malformation. Draining veins towards the superior sagittal sinus.  Mild calcification right internal carotid artery cavernous segment without high-grade stenosis as questioned on MR.  Proximal basilar artery fenestration without saccular aneurysm.    Dg Chest 2 View 03/09/15   1. No acute cardiopulmonary process seen. 2. Multiple chronic left-sided rib deformities noted.     Ct Head Wo Contrast 03/24/2015   Right anterior frontal lobe hematoma has increased slightly in size now 7.2 x 4.4 cm versus prior 7 x 4.4 cm.  Midline shift to the left by 1.3 cm  versus prior 1.2 cm.  Moderate to large amount of intraventricular blood.  Trapping of the temporal horns greater on the left.  More evident is subarachnoid blood and/ or pseudo subarachnoid appearance from diffuse edema.  Calcification right parietal lobe consistent with underlying arteriovenous malformation.  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  Enlarged right middle cerebral artery branch supplying right parietal lobe larger arteriovenous malformation. Draining veins towards the superior sagittal sinus.  Mild calcification right internal carotid artery cavernous segment without high-grade stenosis as questioned on MR.  Proximal basilar artery fenestration without saccular aneurysm   03-09-15   6.6 x 3.8 cm hematoma, resulting in 6 mm RIGHT to LEFT subfalcine herniation.  Intraventricular extension without hydrocephalus.  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    MRI HEAD 02/25/2015   Enlarging 7 x 4.4 cm RIGHT inferior frontal lobe intraparenchymal hematoma, worsening mass-effect with 12 mm RIGHT to LEFT subfalcine herniation.  Intraventricular extension of hemorrhage, new mild hydrocephalus.    MRA HEAD 03/02/2015   11 x 7 mm LEFT A1-2 junction aneurysm, cause of RIGHT frontal lobe hemorrhage.  Partially characterized RIGHT parietal AVM, and enlarged RIGHT middle cerebral feeding arteries. Large draining vein to the superior sagittal sinus seen on MRI.     Dg Chest Port 1 View 03/18/2015   1. Left IJ line noted ending about the distal SVC. 2. Mild left basilar airspace opacity likely reflects atelectasis. Lungs otherwise grossly clear.    2015/03/09    1. Endotracheal tube seen ending 4-5 cm above the carina. 2. Enteric tube noted extending below the diaphragm. 3. Vascular congestion, with increased interstitial markings, more prominent on the left, new from the recent prior study. This may reflect mildly asymmetric  interstitial edema.     EEG abnormal electroencephalogram secondary to the decreased amplitude noted over the right hemisphere. This is consistent with the patient's history of a right ICH. No epileptiform activity is noted.    PHYSICAL EXAM  Temp:  [97.6 F (36.4 C)-102 F (38.9 C)] 97.8 F (36.6 C) (10/09 0545) Pulse Rate:  [72-131] 103 (10/09 0900) Resp:  [17-38] 22 (10/09 0900) BP: (78-160)/(18-144) 144/81 mmHg (10/09 0900) SpO2:  [95 %-100 %] 99 % (10/09 0900) FiO2 (%):  [40 %] 40 % (10/09 0814) Weight:  [186 lb 1.1 oz (84.4 kg)] 186 lb 1.1 oz (84.4 kg) (10/09 0400)  General - Well nourished, well developed, still intubated.  Ophthalmologic - Fundi not visualized due to noncooperation.  Cardiovascular - Regular rate and rhythm with no murmur.  Neuro - intubated, on vent, eyes open, following  simple commands. PERRL, EOMI, positive corneal, gag, tongue in middle. Left hemiplegia, no movement even on pain stimulation, left UE 4/5 proximal and 5/5 distally. LLE AKA, 5/5 proximal strength. DTR 1+ and no babinski.   Assessment and Plan: Mr. Dennis Anthony is a 49 y.o. male with history of bipolar disorder, polysubstance abuse presenting with possible seizure. CT in ED with R frontal ICH with 6mm right to left Brain herniation. Intubated in the ED.   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   Resultant  left hemiplegia  MRI  Enlarging R inferior frontal lobe ICH, with worsening R to L subfalcine herniation  MRA and CTA  L ACOM aneurysm,  RIGHT parietal AVM with enlarged RIGHT middle cerebral feeding arteries.  S/p aneurysm clipping and hematoma evacuation  Repeat CTA showed residue 5x3 mm aneurysm  EEG low amplitude over the right hemisphere, consistent with the patient's history of a right ICH.  Transcranial Doppler increased velocity of right MCA  On nimodipine for  vasospasm prevention  SCDs for VTE prophylaxis Diet NPO time specified, on TF  no antithrombotic prior to admission, no antithrombotic due to ICH and SAH  Therapy recommendations:  pending   Disposition:  Pending, family declined PEG and trach, plan for one way extubation   Cytotoxic Cerebral Edema  off on 3% sodium now  Na 149  CT repeat showed decreased midline shift post evacuation  Right parietal AVM   Likely chronic and not related to current ICH/SAH  NSG follow up as outpt  Acute Respiratory Failure  Intubated in ED  On vent  Not able to wean off vent so far  Family refused trach  One way extubation Monday  Hypertension/hypotension  Off pressor  BP goal < 180 for vasospasm prevention  Agitation  Has been on fentyl PRN  Put on precedex   CCM on board  Other Active Problems  bipoloar disorder/schizoaffective disorder  Right AKA  This patient is critically ill due to ICH, SAH, aneurysm rupture, AVM, respiratory failure and at significant risk of neurological worsening, death form hematoma enlargement, rebleeding, vasospasm, ischemic stroke, heart failure. This patient's care requires constant monitoring of vital signs, hemodynamics, respiratory and cardiac monitoring, review of multiple databases, neurological assessment, discussion with family, other specialists and medical decision making of high complexity. I spent 45 minutes of neurocritical care time in the care of this patient.   Marvel Plan, MD PhD Stroke Neurology 03/02/2015 12:58 PM  To contact Stroke Continuity provider, please refer to [email or web address]. After hours, contact General Neurology

## 2015-03-02 NOTE — Progress Notes (Signed)
Name: Dennis Anthony MRN: [id number] DOB: 06-21-1965    ADMISSION DATE:  02-27-2015 CONSULTATION DATE:  03/14/2015  REFERRING MD :  Dr Hosie Poisson  CHIEF COMPLAINT:  Acute resp failure  REASON FOR CONSULTATION: vent management  HISTORY OF PRESENT ILLNESS:  Patient is a 49 year old Caucasian male with past medical history significant for psychiatric issues including bipolar disorder schizoaffective disorder and polysubstance abuse who, per report, presents to emergency department at [facility] with complaints of a brief episode of loss of consciousness and shaking. There is question on whether or not this is seizure-like activity noted by EMS. CT of the head was performed which noted a 6.6 x 3.8 cm right inferior frontal lobe intraparenchymal hematoma with surrounding low-density vasogenic edema. Also noted to have a 6 mm right-to-left subfalcine herniation with a trace amount of subarachnoid hemorrhage. The patient was intubated for airway protection and transferred to our neuro ICU.   SUBJECTIVE:  Febrile overnight.  Awake and following commands on the right.   VITAL SIGNS: Temp:  [97.6 F (36.4 C)-102 F (38.9 C)] 97.8 F (36.6 C) (10/09 0545) Pulse Rate:  [72-131] 103 (10/09 0900) Resp:  [17-38] 22 (10/09 0900) BP: (78-160)/(18-144) 144/81 mmHg (10/09 0900) SpO2:  [95 %-100 %] 99 % (10/09 0900) FiO2 (%):  [40 %] 40 % (10/09 0814) Weight:  [84.4 kg (186 lb 1.1 oz)] 84.4 kg (186 lb 1.1 oz) (10/09 0400)  PHYSICAL EXAMINATION: General: Intubated, following commands on the right. Neuro:  Shook hands with me this AM, moving right not left. HEENT:  Moist mucus membranes. Cardiovascular:  S1, S2, RRR, no MRG. Lungs:  Clear, No wheeze or crackles. Abdomen:  + BS, soft, non tender, non distended. Skin:  Intact. Multiple tattos  Recent Labs Lab 02/28/15 0600  03/01/15 0445  03/01/15 1634 03/01/15 2315 03/02/15 0415  NA 156*  < > 148*  < > 147* 146* 149*  K 2.7*  --  3.2*  --   --   --   3.6  CL 119*  --  112*  --   --   --  113*  CO2 27  --  27  --   --   --  27  BUN 10  --  9  --   --   --  10  CREATININE 0.65  --  0.59*  --   --   --  0.54*  GLUCOSE 194*  --  243*  --   --   --  149*  < > = values in this interval not displayed.  Recent Labs Lab 02/28/15 0600 03/01/15 0445 03/02/15 0415  HGB 13.6 14.4 13.3  HCT 43.3 45.4 42.0  WBC 19.2* 21.0* 17.5*  PLT 200 191 165   ASSESSMENT / PLAN: Acute respiratory failure secondary to encephalopathy caused by the intraparenchymal hematoma with vasogenic edema..  PS but no extubation until tomorrow, one way extubation then DNR status in AM. Pan cultures negative except sputum with polymicrobia. CXR and ABG in AM. Continue TF. Precedex for agitation. D/C 3% saline. Cardene off. Titrate levophed down, no need for hypertension since not effective thus far. D/C beta blockers. BMET in AM. Replace electrolytes as indicated.  Discussed with family, no trach/peg, will maintain intubated til day 7 which is Monday then likely one way extubation then.  Discussed with NS and neurology.  The patient is critically ill with multiple organ systems failure and requires high complexity decision making for assessment and support, frequent evaluation and titration  of therapies, application of advanced monitoring technologies and extensive interpretation of multiple databases.   Critical Care Time devoted to patient care services described in this note is  35  Minutes. This time reflects time of care of this signee Dr Koren Bound. This critical care time does not reflect procedure time, or teaching time or supervisory time of PA/NP/Med student/Med Resident etc but could involve care discussion time.  Alyson Reedy, M.D. [facility] Pulmonary/Critical Care Medicine. Pager: [phone]. After hours pager: [phone].  03/02/2015, 9:37 AM

## 2015-03-02 NOTE — Progress Notes (Signed)
Pt seen and examined. No issues overnight.   EXAM: Temp:  [97.6 F (36.4 C)-101.4 F (38.6 C)] 101.4 F (38.6 C) (10/09 2030) Pulse Rate:  [72-118] 85 (10/09 2030) Resp:  [17-32] 18 (10/09 2030) BP: (106-150)/(53-99) 108/64 mmHg (10/09 2030) SpO2:  [95 %-100 %] 99 % (10/09 2030) FiO2 (%):  [30 %-40 %] 30 % (10/09 1911) Weight:  [84.4 kg (186 lb 1.1 oz)] 84.4 kg (186 lb 1.1 oz) (10/09 0400) Intake/Output      10/09 0701 - 10/10 0700   I.V. (mL/kg) 356.2 (4.2)   NG/GT 1040   IV Piggyback 205   Total Intake(mL/kg) 1601.2 (19)   Urine (mL/kg/hr) 900 (0.8)   Stool 150 (0.1)   Total Output 1050   Net +551.2        Opens eyes to voice Breathes spontaneously  Follows commands briskly RUE/RLE No movement on Left  LABS: Lab Results  Component Value Date   CREATININE 0.54* 03/02/2015   BUN 10 03/02/2015   NA 149* 03/02/2015   K 3.6 03/02/2015   CL 113* 03/02/2015   CO2 27 03/02/2015   Lab Results  Component Value Date   WBC 17.5* 03/02/2015   HGB 13.3 03/02/2015   HCT 42.0 03/02/2015   MCV 89.7 03/02/2015   PLT 165 03/02/2015    IMPRESSION: - 49 y.o. male SAH d# 7 s/p Acom clipping, unchanged left hemiplegia  PLAN: - Cont IVF hydration, - Cont nimotop/TCD - Wean as tolerated - Plan on extubation with family on Mon. They have indicated Dennis Anthony would not want trach/PEG.

## 2015-03-03 ENCOUNTER — Inpatient Hospital Stay (HOSPITAL_COMMUNITY): Payer: Medicare HMO

## 2015-03-03 DIAGNOSIS — R4020 Unspecified coma: Secondary | ICD-10-CM

## 2015-03-03 DIAGNOSIS — Q282 Arteriovenous malformation of cerebral vessels: Secondary | ICD-10-CM

## 2015-03-03 LAB — BASIC METABOLIC PANEL
ANION GAP: 8 (ref 5–15)
BUN: 14 mg/dL (ref 6–20)
CHLORIDE: 115 mmol/L — AB (ref 101–111)
CO2: 24 mmol/L (ref 22–32)
Calcium: 8.7 mg/dL — ABNORMAL LOW (ref 8.9–10.3)
Creatinine, Ser: 0.57 mg/dL — ABNORMAL LOW (ref 0.61–1.24)
GFR calc non Af Amer: >60 mL/min (ref >60)
GLUCOSE: 148 mg/dL — AB (ref 65–99)
POTASSIUM: 4 mmol/L (ref 3.5–5.1)
Sodium: 147 mmol/L — ABNORMAL HIGH (ref 135–145)

## 2015-03-03 LAB — POCT I-STAT 3, ART BLOOD GAS (G3+)
Acid-base deficit: 1 mmol/L (ref 0.0–2.0)
Bicarbonate: 23.4 mEq/L (ref 20.0–24.0)
O2 SAT: 96 %
PH ART: 7.426 (ref 7.350–7.450)
Patient temperature: 98.6
TCO2: 24 mmol/L (ref 0–100)
pCO2 arterial: 35.5 mmHg (ref 35.0–45.0)
pO2, Arterial: 77 mmHg — ABNORMAL LOW (ref 80.0–100.0)

## 2015-03-03 LAB — CBC
HCT: 39.9 % (ref 39.0–52.0)
HEMOGLOBIN: 12.5 g/dL — AB (ref 13.0–17.0)
MCH: 28.3 pg (ref 26.0–34.0)
MCHC: 31.3 g/dL (ref 30.0–36.0)
MCV: 90.5 fL (ref 78.0–100.0)
Platelets: 215 10*3/uL (ref 150–400)
RBC: 4.41 MIL/uL (ref 4.22–5.81)
RDW: 16.3 % — ABNORMAL HIGH (ref 11.5–15.5)
WBC: 19 10*3/uL — AB (ref 4.0–10.5)

## 2015-03-03 LAB — MAGNESIUM: Magnesium: 2.1 mg/dL (ref 1.7–2.4)

## 2015-03-03 LAB — CULTURE, BLOOD (ROUTINE X 2)
CULTURE: NO GROWTH
Culture: NO GROWTH

## 2015-03-03 LAB — GLUCOSE, CAPILLARY
GLUCOSE-CAPILLARY: 117 mg/dL — AB (ref 65–99)
GLUCOSE-CAPILLARY: 113 mg/dL — AB (ref 65–99)
Glucose-Capillary: 135 mg/dL — ABNORMAL HIGH (ref 65–99)

## 2015-03-03 LAB — PHOSPHORUS: PHOSPHORUS: 3.2 mg/dL (ref 2.5–4.6)

## 2015-03-03 MED ORDER — FENTANYL BOLUS VIA INFUSION
50.0000 ug | INTRAVENOUS | Status: DC | PRN
  Filled 2015-03-03: qty 100

## 2015-03-03 MED ORDER — SODIUM CHLORIDE 0.9 % IV SOLN
25.0000 ug/h | INTRAVENOUS | Status: DC
  Administered 2015-03-03: 50 ug/h via INTRAVENOUS
  Filled 2015-03-03: qty 50

## 2015-03-03 MED ORDER — FENTANYL BOLUS VIA INFUSION
50.0000 ug | INTRAVENOUS | Status: DC | PRN
  Filled 2015-03-03: qty 200

## 2015-03-03 MED ORDER — SODIUM CHLORIDE 0.9 % IV SOLN
100.0000 ug/h | INTRAVENOUS | Status: DC
  Filled 2015-03-03: qty 50

## 2015-03-03 MED ORDER — LORAZEPAM BOLUS VIA INFUSION
2.0000 mg | INTRAVENOUS | Status: DC | PRN
  Filled 2015-03-03: qty 5

## 2015-03-03 MED ORDER — ACETAMINOPHEN 160 MG/5ML PO SOLN
650.0000 mg | ORAL | Status: DC | PRN
  Administered 2015-03-03: 650 mg
  Filled 2015-03-03: qty 20.3

## 2015-03-03 MED ORDER — DEXTROSE 5 % IV SOLN
5.0000 mg/h | INTRAVENOUS | Status: DC
  Administered 2015-03-03: 5 mg/h via INTRAVENOUS
  Filled 2015-03-03 (×2): qty 25

## 2015-03-03 MED ORDER — LORAZEPAM 2 MG/ML IJ SOLN
1.0000 mg | INTRAMUSCULAR | Status: DC | PRN
  Administered 2015-03-03 (×2): 2 mg via INTRAVENOUS
  Filled 2015-03-03 (×3): qty 1

## 2015-03-03 NOTE — Progress Notes (Signed)
Pt seen and examined. No issues overnight.   EXAM: Temp:  [97.9 F (36.6 C)-101.4 F (38.6 C)] 97.9 F (36.6 C) (10/10 1147) Pulse Rate:  [61-118] 97 (10/10 1300) Resp:  [16-36] 29 (10/10 1300) BP: (87-150)/(48-98) 116/48 mmHg (10/10 1300) SpO2:  [97 %-100 %] 100 % (10/10 1300) FiO2 (%):  [30 %-40 %] 40 % (10/10 1124) Weight:  [85 kg (187 lb 6.3 oz)] 85 kg (187 lb 6.3 oz) (10/10 0300) Intake/Output      10/09 0701 - 10/10 0700 10/10 0701 - 10/11 0700   I.V. (mL/kg) 1634.6 (19.2) 280.2 (3.3)   NG/GT 1600 480   IV Piggyback 810 205   Total Intake(mL/kg) 4044.6 (47.6) 965.2 (11.4)   Urine (mL/kg/hr) 900 (0.4) 875 (1.5)   Stool 1115 (0.5)    Total Output 2015 875   Net +2029.6 +90.2         Awake, alert FC on right No movement left  LABS: Lab Results  Component Value Date   CREATININE 0.57* 03/03/2015   BUN 14 03/03/2015   NA 147* 03/03/2015   K 4.0 03/03/2015   CL 115* 03/03/2015   CO2 24 03/03/2015   Lab Results  Component Value Date   WBC 19.0* 03/03/2015   HGB 12.5* 03/03/2015   HCT 39.9 03/03/2015   MCV 90.5 03/03/2015   PLT 215 03/03/2015    IMPRESSION: - 49 y.o. male s/p aneurysm rupture, pts family do not think pt would have wanted trach/PEG or prolonged intubation  PLAN: - Will extubate today, comfort measures if increased work of breathing, not tolerating extubation.

## 2015-03-03 NOTE — Addendum Note (Signed)
Addendum  created 03/03/15 1116 by Shelton Silvas, MD   Modules edited: Anesthesia Attestations

## 2015-03-03 NOTE — Procedures (Signed)
Extubation Procedure Note  Patient Details:   Name: Dennis Anthony DOB: [birth year] MRN: [id number]   Airway Documentation:     Evaluation  O2 sats: stable throughout Complications: No apparent complications Patient did tolerate procedure well. Bilateral Breath Sounds: Rhonchi Suctioning: Airway No Pt extubated to 4l Central w/humidity.  RN present at bedside Christophe Louis 03/03/2015, 12:13 PM

## 2015-03-03 NOTE — Progress Notes (Signed)
Transcranial Doppler  Date POD PCO2 HCT BP  MCA ACA PCA OPHT SIPH VERT Basilar  02/25/15 MS  34.8 41.4 172/78 Right  Left   154  32   -61  -39   -  -   35  29   -  -   -  -28     -25    02/26/15 MS  25.5 41.1 176/84 Right  Left   175 21     -39  -57   55 - -  -   -  -37     -54    02/28/15 MS  33.9 43.3 112/60 Right  Left   170  91   -32  -   54  -   53  32   -  -   -  -   -  -     03/03/15 VS     Right  Left   180  127   -30  -23   47  34   36  29   -  -   -26  -     -          Right  Left                                            Right  Left                                            Right  Left                                        MCA = Middle Cerebral Artery      OPHT = Opthalmic Artery     BASILAR = Basilar Artery   ACA = Anterior Cerebral Artery     SIPH = Carotid Siphon PCA = Posterior Cerebral Artery   VERT = Verterbral Artery                   Normal MCA = 62+\-12 ACA = 50+\-12 PCA = 42+\-23   -Unable to insonate - = Unable to insonate 03/03/2015 03/03/2015 5:09 PM [employer], RVS

## 2015-03-03 NOTE — Progress Notes (Signed)
eLink Physician-Brief Progress Note Patient Name: Dennis Anthony DOB: 07/08/1965 MRN: [id number]   Date of Service  03/03/2015  HPI/Events of Note  DNR, actively dying, spoke with sister, will proceed with comfort care.  eICU Interventions  Place comfort care measures order set with fentanyl and ativan.     Intervention Category Major Interventions: Acid-Base disturbance - evaluation and management;End of life / care limitation discussion  YACOUB,WESAM 03/03/2015, 8:25 PM

## 2015-03-03 NOTE — Progress Notes (Signed)
STROKE TEAM PROGRESS NOTE   SUBJECTIVE (INTERVAL HISTORY) Two brothers are at bedisde. TCD still showed elevated Rt MCA velocities, still on vent, not able to wean off. Discussed with brothers, they agree with one way extubation. However, if pt tolerating well post extubation, we have to access the language and competency after making decisions for himself. Brothers in agreement.   OBJECTIVE Temp:  [97.9 F (36.6 C)-101.4 F (38.6 C)] 100.4 F (38 C) (10/10 1600) Pulse Rate:  [61-118] 101 (10/10 1500) Cardiac Rhythm:  [-] Sinus tachycardia (10/10 1200) Resp:  [16-36] 35 (10/10 1500) BP: (87-150)/(48-98) 142/79 mmHg (10/10 1500) SpO2:  [97 %-100 %] 100 % (10/10 1500) FiO2 (%):  [30 %-40 %] 40 % (10/10 1124) Weight:  [187 lb 6.3 oz (85 kg)] 187 lb 6.3 oz (85 kg) (10/10 0300)  CBC:   Recent Labs Lab 03/02/15 0415 03/03/15 0500  WBC 17.5* 19.0*  HGB 13.3 12.5*  HCT 42.0 39.9  MCV 89.7 90.5  PLT 165 215    Basic Metabolic Panel:  Recent Labs Lab 03/02/15 0415  03/02/15 2250 03/03/15 0500  NA 149*  < > 146* 147*  K 3.6  --   --  4.0  CL 113*  --   --  115*  CO2 27  --   --  24  GLUCOSE 149*  --   --  148*  BUN 10  --   --  14  CREATININE 0.54*  --   --  0.57*  CALCIUM 8.8*  --   --  8.7*  MG 2.3  --   --  2.1  PHOS 3.3  --   --  3.2  < > = values in this interval not displayed.  Lipid Panel:     Component Value Date/Time   TRIG 123 03/02/2015 0415   HgbA1c: No results found for: HGBA1C Urine Drug Screen:     Component Value Date/Time   LABOPIA NONE DETECTED 03/10/2015 2200   COCAINSCRNUR NONE DETECTED 03/18/2015 2200   LABBENZ NONE DETECTED 03/08/2015 2200   AMPHETMU NONE DETECTED 03/06/2015 2200   THCU NONE DETECTED 03/19/2015 2200   LABBARB NONE DETECTED 03/05/2015 2200      IMAGING  Ct Angio Head W/cm &/or Wo Cm  02/28/2015   IMPRESSION: 1. Sequelae of recent anterior communicating artery aneurysm clipping. Small residual aneurysm base measuring 5 x  3 mm as above. 2. No evidence of significant intracranial arterial stenosis. Mild narrowing of both A1 segments and minimal bilateral M2 narrowing. 3. Right frontal parenchymal hematoma with surrounding edema, not significantly changed from yesterday's head CT. 4. Small volume intraventricular hemorrhage, slightly decreased in the interim. 5. Slightly increased size of low-density left subdural fluid collection suggestive of hygroma. 6. Incomplete CT perfusion processing at this time due to technical reasons. No clearly significant abnormal perfusion on the limited data available at this time. Full analysis will be performed when software errors are corrected.    26-Mar-2015   Right anterior frontal lobe hematoma has increased slightly in size now 7.2 x 4.4 cm versus prior 7 x 4.4 cm.  Midline shift to the left by 1.3 cm versus prior 1.2 cm.  Moderate to large amount of intraventricular blood.  Trapping of the temporal horns greater on the left.  More evident is subarachnoid blood and/ or pseudo subarachnoid appearance from diffuse edema.  Calcification right parietal lobe consistent with underlying arteriovenous malformation.  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  Enlarged right middle cerebral artery branch supplying right parietal lobe larger arteriovenous malformation. Draining veins towards the superior sagittal sinus.  Mild calcification right internal carotid artery cavernous segment without high-grade stenosis as questioned on MR.  Proximal basilar artery fenestration without saccular aneurysm.    Dg Chest 2 View 03/23/2015   1. No acute cardiopulmonary process seen. 2. Multiple chronic left-sided rib deformities noted.     Ct Head Wo Contrast March 09, 2015   Right anterior frontal lobe hematoma has increased slightly in size now 7.2 x 4.4 cm versus prior 7 x 4.4 cm.  Midline shift to the left by 1.3 cm versus prior 1.2 cm.  Moderate to large amount of intraventricular  blood.  Trapping of the temporal horns greater on the left.  More evident is subarachnoid blood and/ or pseudo subarachnoid appearance from diffuse edema.  Calcification right parietal lobe consistent with underlying arteriovenous malformation.  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  Enlarged right middle cerebral artery branch supplying right parietal lobe larger arteriovenous malformation. Draining veins towards the superior sagittal sinus.  Mild calcification right internal carotid artery cavernous segment without high-grade stenosis as questioned on MR.  Proximal basilar artery fenestration without saccular aneurysm   02/28/2015   6.6 x 3.8 cm hematoma, resulting in 6 mm RIGHT to LEFT subfalcine herniation.  Intraventricular extension without hydrocephalus.  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    MRI HEAD March 09, 2015   Enlarging 7 x 4.4 cm RIGHT inferior frontal lobe intraparenchymal hematoma, worsening mass-effect with 12 mm RIGHT to LEFT subfalcine herniation.  Intraventricular extension of hemorrhage, new mild hydrocephalus.    MRA HEAD 09-Mar-2015   11 x 7 mm LEFT A1-2 junction aneurysm, cause of RIGHT frontal lobe hemorrhage.  Partially characterized RIGHT parietal AVM, and enlarged RIGHT middle cerebral feeding arteries. Large draining vein to the superior sagittal sinus seen on MRI.     Dg Chest Port 1 View 03-09-15   1. Left IJ line noted ending about the distal SVC. 2. Mild left basilar airspace opacity likely reflects atelectasis. Lungs otherwise grossly clear.    03/09/2015    1. Endotracheal tube seen ending 4-5 cm above the carina. 2. Enteric tube noted extending below the diaphragm. 3. Vascular congestion, with increased interstitial markings, more prominent on the left, new from the recent prior study. This may reflect mildly asymmetric interstitial edema.     EEG abnormal electroencephalogram secondary to  the decreased amplitude noted over the right hemisphere. This is consistent with the patient's history of a right ICH. No epileptiform activity is noted.    PHYSICAL EXAM  Temp:  [97.9 F (36.6 C)-101.4 F (38.6 C)] 100.4 F (38 C) (10/10 1600) Pulse Rate:  [61-118] 101 (10/10 1500) Resp:  [16-36] 35 (10/10 1500) BP: (87-150)/(48-98) 142/79 mmHg (10/10 1500) SpO2:  [97 %-100 %] 100 % (10/10 1500) FiO2 (%):  [30 %-40 %] 40 % (10/10 1124) Weight:  [187 lb 6.3 oz (85 kg)] 187 lb 6.3 oz (85 kg) (10/10 0300)  General - Well nourished, well developed, still intubated.  Ophthalmologic - Fundi not visualized due to noncooperation.  Cardiovascular - Regular rate and rhythm with no murmur.  Neuro - intubated, on vent, eyes open, following simple commands. PERRL, EOMI, positive corneal, gag, tongue in middle. Left hemiplegia, no movement even on pain stimulation, left UE 4/5 proximal and 5/5 distally. LLE AKA, 5/5 proximal strength. DTR 1+ and no  babinski.   Assessment and Plan: Mr. Dennis Anthony is a 49 y.o. male with history of bipolar disorder, polysubstance abuse presenting with possible seizure. CT in ED with R frontal ICH with 6mm right to left Brain herniation. Intubated in the ED.   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   Resultant  left hemiplegia  MRI  Enlarging R inferior frontal lobe ICH, with worsening R to L subfalcine herniation  MRA and CTA  L ACOM aneurysm,  RIGHT parietal AVM with enlarged RIGHT middle cerebral feeding arteries.  S/p aneurysm clipping and hematoma evacuation  Repeat CTA showed residue 5x3 mm aneurysm  EEG low amplitude over the right hemisphere, consistent with the patient's history of a right ICH.  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 and not related to current ICH/SAH  NSG follow up as outpt  Acute Respiratory Failure  Intubated in ED  On vent  Not able to wean off vent so far  Family refused trach and PEG  One way extubation after discuss with brothers   Hypertension/hypotension  Off pressor  BP goal < 180 for vasospasm prevention  Other Active Problems  bipoloar disorder/schizoaffective disorder  Right AKA  This patient is critically ill due to ICH, SAH, aneurysm rupture, AVM, respiratory failure and at significant risk of neurological worsening, death form hematoma enlargement, rebleeding, vasospasm, ischemic stroke, heart failure. This patient's care requires constant monitoring of vital signs, hemodynamics, respiratory and cardiac monitoring, review of multiple databases, neurological assessment, discussion with family, other specialists and medical decision making of high complexity. Had long discussion with brothers at bedside regarding extubation, decision-making capacity, future disposition. I spent 40 minutes of neurocritical care time in the care of this patient.   Marvel Plan, MD PhD Stroke Neurology 03/03/2015 5:30 PM  To contact Stroke Continuity provider, please refer to [email or web address]. After hours, contact General Neurology

## 2015-03-03 NOTE — Progress Notes (Signed)
Name: Dennis Anthony MRN: [id number] DOB: 10-Dec-1965    ADMISSION DATE:  03/11/2015 CONSULTATION DATE:  March 26, 2015  REFERRING MD :  Dr Hosie Poisson  CHIEF COMPLAINT:  Acute resp failure  REASON FOR CONSULTATION: vent management  HISTORY OF PRESENT ILLNESS:  Patient is a 49 year old Caucasian male with past medical history significant for psychiatric issues including bipolar disorder schizoaffective disorder and polysubstance abuse who, per report, presents to emergency department at [facility] with complaints of a brief episode of loss of consciousness and shaking. There is question on whether or not this is seizure-like activity noted by EMS. CT of the head was performed which noted a 6.6 x 3.8 cm right inferior frontal lobe intraparenchymal hematoma with surrounding low-density vasogenic edema. Also noted to have a 6 mm right-to-left subfalcine herniation with a trace amount of subarachnoid hemorrhage. The patient was intubated for airway protection and transferred to our neuro ICU.   SUBJECTIVE: No acute events overnight. Patient signifies that he wishes to have endotracheal tube removed. Continues to follow commands and right upper extremity.  REVIEW OF SYSTEMS:  Unobtainable as patient is intubated.   VITAL SIGNS: Temp:  [98.6 F (37 C)-101.4 F (38.6 C)] 99.3 F (37.4 C) (10/10 0748) Pulse Rate:  [61-118] 89 (10/10 0800) Resp:  [16-32] 24 (10/10 0800) BP: (87-150)/(56-98) 125/69 mmHg (10/10 0800) SpO2:  [97 %-100 %] 100 % (10/10 0800) FiO2 (%):  [30 %-40 %] 40 % (10/10 0800) Weight:  [187 lb 6.3 oz (85 kg)] 187 lb 6.3 oz (85 kg) (10/10 0300)  PHYSICAL EXAMINATION: General:  Awake. No acute distress. Family at bedside.  Integument:  Warm & dry. No rash on exposed skin.  HEENT: Endotracheal tube in place. PERRL. Cardiovascular:  Regular rate. No edema. No appreciable JVD.  Pulmonary:  Coarse breath sounds bilaterally. Symmetric chest wall rise on ventilator.  Abdomen: Soft. Normal bowel  sounds. Nondistended.  Neurological: Patient does track with his eyes. He is able to give a thumbs up on his right hand but does not withdraw to pain on the left.   Recent Labs Lab 03/01/15 0445  03/02/15 0415  03/02/15 1700 03/02/15 2250 03/03/15 0500  NA 148*  < > 149*  < > 149* 146* 147*  K 3.2*  --  3.6  --   --   --  4.0  CL 112*  --  113*  --   --   --  115*  CO2 27  --  27  --   --   --  24  BUN 9  --  10  --   --   --  14  CREATININE 0.59*  --  0.54*  --   --   --  0.57*  GLUCOSE 243*  --  149*  --   --   --  148*  < > = values in this interval not displayed.  Recent Labs Lab 03/01/15 0445 03/02/15 0415 03/03/15 0500  HGB 14.4 13.3 12.5*  HCT 45.4 42.0 39.9  WBC 21.0* 17.5* 19.0*  PLT 191 165 215   ASSESSMENT / PLAN: 49 year old Caucasian male with acute respiratory failure secondary to encephalopathy caused by the intraparenchymal hematoma with vasogenic edema. Multiple family discussions have been held by neurology as well as myself and Dr. Braxton Feathers yesterday. Respiratory status remains tenuous. Family have expressed that the patient would not wish to be maintained on a ventilator or undergo tracheostomy or PEG tube placement. Given these wishes we are planning for extubation today  and the patient's CODE STATUS has been switched to DO NOT RESUSCITATE. I have spoken with respiratory therapy as well as the patient's bedside nurse regarding the plan around extubation. Family wished the patient to be made comfortable and not suffer should he experience increased work of breathing or desaturation. For that I have ordered a fentanyl drip to be started for any respiratory distress with bolus available via infusion. I have also ordered Ativan IV as needed for any anxiety that the patient may experience from respiratory distress or increased work of breathing.  I spent a total of 33 minutes of critical care time today caring for the patient, discussing the plan of care with the  patient's family and other caregivers at bedside, & reviewing the patient's electronic medical record.  Donna Christen Jamison Neighbor, M.D. [facility] Pulmonary & Critical Care Pager:  [phone] After 3pm or if no response, call [phone]  03/03/2015, 11:15 AM

## 2015-03-03 NOTE — Progress Notes (Signed)
Assessed pt. ETT at 21cm. supposed to be at 23cm.  Advanced ETT to 23cm

## 2015-03-04 ENCOUNTER — Telehealth: Payer: Self-pay | Admitting: Neurology

## 2015-03-06 LAB — CULTURE, RESPIRATORY W GRAM STAIN

## 2015-03-06 LAB — CULTURE, RESPIRATORY

## 2015-03-06 NOTE — Telephone Encounter (Signed)
[name][facility] [phone] [place] called back requesting Dr. Warren DanesXu's signature on death certificate. Was advised previously by us to call hospital back and called hospital and Medical Records told her that "Dr. Roda ShuttersXu wrote notes in chart on 9th and 10th and was supposedly the one who released the body". Patient is being cremated and they need a signature soon. Please call to advise.

## 2015-03-06 NOTE — Telephone Encounter (Signed)
Rn call [facility] at [facility] at [phone]. The person stated Dennis Anthony had left. Rn stated Dr. Roda ShuttersXu did see the person in the hospital. Rn stated a message will be sent to Dr. Roda ShuttersXu if he can sign the death certificate depending if patient died at home or hospital. Dennis BlaseDebbie will be call on Monday.

## 2015-03-06 NOTE — Telephone Encounter (Signed)
Dennis [facility] from [facility] returned Dennis Anthony's call. I explained she would need to call the hospital for critical care dr to sign as our DR's did not see pt. She is requesting Dennis Anthony to call her.

## 2015-03-10 NOTE — Telephone Encounter (Signed)
Rn call patient back about death certificate fax to [facility] at [phone]. Rn stated Dr. Roda ShuttersXu can sign the fax copy for the patient.Eunice BlaseDebbie also stated she has the correct address and will mail the original in to sign.

## 2015-03-10 NOTE — Telephone Encounter (Signed)
[name] [phone] returned call regarding signing of death certificate. Date of death 03/22/2015. [facility]. She will fax one so that she can get a signed copy in order to be able to do cremation and then she will mail one.

## 2015-03-11 NOTE — Telephone Encounter (Signed)
Rn call [name] back at [facility] at [phone]. [name] stated she receive the sign and completed death certificate by Dr. Virgel BouquetXu. [name] stated she receive the fax copy, and stated [facility]

## 2015-03-25 NOTE — Progress Notes (Signed)
   03/12/2015 0914  Clinical Encounter Type  Visited With Health care provider  Visit Type Follow-up;Death  Referral From Physician   Chaplain attempted to follow-up and respond to an end of life consult, but patient had already died. Chaplain support available as needed.   Alda Ponder, Chaplain 03/23/2015 9:15 AM

## 2015-03-25 NOTE — Progress Notes (Signed)
Wasted 100cc of Fentanyl left in IV bag into sink witnessed by Ferman Hamming, RN.

## 2015-03-25 NOTE — Progress Notes (Deleted)
Stroke Discharge Summary  Patient ID: Dennis Anthony   MRN: [id number]      DOB: [birth year]  Date of Admission: 16-Mar-2015 Date of Discharge: 03/20/2015  Attending Physician:  No att. providers found, Stroke MD  Consulting Physician(s):   Treatment Team:  Hilda Lias, MD Lisbeth Renshaw, MD pulmonary/intensive care  Patient's PCP:  No PCP Per Patient  DISCHARGE DIAGNOSIS:  Active Problems:   Right frontal hemorrhagic stroke (HCC)   ICH (intracerebral hemorrhage) (HCC)   Ruptured ACOM aneurysm   Right parietal AVM   Cytotoxic brain edema (HCC)   Brain herniation (HCC)   Obstructive hydrocephalus   Coma (HCC)   Acute respiratory failure (HCC)   Cerebral vasospasm   BMI: Body mass index is 27.66 kg/(m^2).  Past Medical History  Diagnosis Date  . Bipolar disorder (HCC)   . Schizoaffective disorder (HCC)   . Polysubstance dependence (HCC)   . H/O suicide attempt June 2016    per brother pt jumped off back porch; wasn't taking his medications appropriately   Past Surgical History  Procedure Laterality Date  . Ankle fracture surgery Left     7-8 years ago per family  . Radiology with anesthesia N/A 02/26/2015    Procedure: RADIOLOGY WITH ANESTHESIA;  Surgeon: Lisbeth Renshaw, MD;  Location: [facility] OR;  Service: Radiology;  Laterality: N/A;  . Craniotomy Right 03/15/2015    Procedure: CRANIOTOMY INTRACRANIAL ANEURYSM ;  Surgeon: Lisbeth Renshaw, MD;  Location: [facility];  Service: Neurosurgery;  Laterality: Right;      Medication List    ASK your doctor about these medications        clonazePAM 0.5 MG tablet  Commonly known as:  KLONOPIN  Take 0.5 mg by mouth 2 (two) times daily as needed for anxiety.     cloZAPine 100 MG tablet  Commonly known as:  CLOZARIL  Take 500 mg by mouth at bedtime.     docusate sodium 100 MG capsule  Commonly known as:  COLACE  Take 100 mg by mouth daily.     ibuprofen 200 MG tablet  Commonly known as:  ADVIL,MOTRIN  Take 400  mg by mouth every 4 (four) hours as needed for mild pain.     omeprazole 20 MG capsule  Commonly known as:  PRILOSEC  Take 20 mg by mouth daily.     risperiDONE 1 MG tablet  Commonly known as:  RISPERDAL  Take 1 mg by mouth 2 (two) times daily.        LABORATORY STUDIES CBC    Component Value Date/Time   WBC 19.0* 03/03/2015 0500   RBC 4.41 03/03/2015 0500   HGB 12.5* 03/03/2015 0500   HCT 39.9 03/03/2015 0500   PLT 215 03/03/2015 0500   MCV 90.5 03/03/2015 0500   MCH 28.3 03/03/2015 0500   MCHC 31.3 03/03/2015 0500   RDW 16.3* 03/03/2015 0500   LYMPHSABS 3.7 03-16-15 2121   MONOABS 1.7* Mar 16, 2015 2121   EOSABS 0.3 03-16-2015 2121   BASOSABS 0.1 03-16-15 2121   CMP    Component Value Date/Time   NA 147* 03/03/2015 0500   K 4.0 03/03/2015 0500   CL 115* 03/03/2015 0500   CO2 24 03/03/2015 0500   GLUCOSE 148* 03/03/2015 0500   BUN 14 03/03/2015 0500   CREATININE 0.57* 03/03/2015 0500   CALCIUM 8.7* 03/03/2015 0500   PROT 7.8 Mar 16, 2015 2121   ALBUMIN 4.0 03/16/2015 2121   AST 22 2015-03-16 2121   ALT  17 03/24/2015 2121   ALKPHOS 98 03/06/2015 2121   BILITOT 0.5 02/27/2015 2121   GFRNONAA >60 03/03/2015 0500   GFRAA >60 03/03/2015 0500   COAGS Lab Results  Component Value Date   INR 1.11 03/19/2015   Lipid Panel    Component Value Date/Time   TRIG 123 03/02/2015 0415   HgbA1C No results found for: HGBA1C Cardiac Panel (last 3 results) No results for input(s): CKTOTAL, CKMB, TROPONINI, RELINDX in the last 72 hours. Urinalysis    Component Value Date/Time   COLORURINE YELLOW 02/26/2015 0838   APPEARANCEUR CLEAR 02/26/2015 0838   LABSPEC 1.018 02/26/2015 0838   PHURINE 5.5 02/26/2015 0838   GLUCOSEU NEGATIVE 02/26/2015 0838   HGBUR SMALL* 02/26/2015 0838   BILIRUBINUR NEGATIVE 02/26/2015 0838   KETONESUR NEGATIVE 02/26/2015 0838   PROTEINUR NEGATIVE 02/26/2015 0838   UROBILINOGEN 1.0 02/26/2015 0838   NITRITE NEGATIVE 02/26/2015 0838    LEUKOCYTESUR NEGATIVE 02/26/2015 0838   Urine Drug Screen     Component Value Date/Time   LABOPIA NONE DETECTED 03/18/2015 2200   COCAINSCRNUR NONE DETECTED 03/18/2015 2200   LABBENZ NONE DETECTED 03/01/2015 2200   AMPHETMU NONE DETECTED 03/09/2015 2200   THCU NONE DETECTED 03/16/2015 2200   LABBARB NONE DETECTED 03/03/2015 2200    Alcohol Level    Component Value Date/Time   ETH 8* 03/07/2015 2121     SIGNIFICANT DIAGNOSTIC STUDIES Ct Angio Head W/cm &/or Wo Cm  02/28/2015 IMPRESSION: 1. Sequelae of recent anterior communicating artery aneurysm clipping. Small residual aneurysm base measuring 5 x 3 mm as above. 2. No evidence of significant intracranial arterial stenosis. Mild narrowing of both A1 segments and minimal bilateral M2 narrowing. 3. Right frontal parenchymal hematoma with surrounding edema, not significantly changed from yesterday's head CT. 4. Small volume intraventricular hemorrhage, slightly decreased in the interim. 5. Slightly increased size of low-density left subdural fluid collection suggestive of hygroma. 6. Incomplete CT perfusion processing at this time due to technical reasons. No clearly significant abnormal perfusion on the limited data available at this time. Full analysis will be performed when software errors are corrected.   02/28/2015 Right anterior frontal lobe hematoma has increased slightly in size now 7.2 x 4.4 cm versus prior 7 x 4.4 cm. Midline shift to the left by 1.3 cm versus prior 1.2 cm. Moderate to large amount of intraventricular blood. Trapping of the temporal horns greater on the left. More evident is subarachnoid blood and/ or pseudo subarachnoid appearance from diffuse edema. Calcification right parietal lobe consistent with underlying arteriovenous malformation. 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 Enlarged right middle cerebral artery branch supplying right parietal lobe larger arteriovenous  malformation. Draining veins towards the superior sagittal sinus. Mild calcification right internal carotid artery cavernous segment without high-grade stenosis as questioned on MR. Proximal basilar artery fenestration without saccular aneurysm.  Dg Chest 2 View 03/09/2015 1. No acute cardiopulmonary process seen. 2. Multiple chronic left-sided rib deformities noted.   Ct Head Wo Contrast 03/10/2015 Right anterior frontal lobe hematoma has increased slightly in size now 7.2 x 4.4 cm versus prior 7 x 4.4 cm. Midline shift to the left by 1.3 cm versus prior 1.2 cm. Moderate to large amount of intraventricular blood. Trapping of the temporal horns greater on the left. More evident is subarachnoid blood and/ or pseudo subarachnoid appearance from diffuse edema. Calcification right parietal lobe consistent with underlying arteriovenous malformation. Complex bilobed anterior communicating artery 11.4 x 8 x 6.4 mm  aneurysm. Enlarged right middle cerebral artery branch supplying right parietal lobe larger arteriovenous malformation. Draining veins towards the superior sagittal sinus. Mild calcification right internal carotid artery cavernous segment without high-grade stenosis as questioned on MR. Proximal basilar artery fenestration without saccular aneurysm  02/26/2015 6.6 x 3.8 cm hematoma, resulting in 6 mm RIGHT to LEFT subfalcine herniation. Intraventricular extension without hydrocephalus. 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   MRI HEAD 2015-03-15 Enlarging 7 x 4.4 cm RIGHT inferior frontal lobe intraparenchymal hematoma, worsening mass-effect with 12 mm RIGHT to LEFT subfalcine herniation. Intraventricular extension of hemorrhage, new mild hydrocephalus.   MRA HEAD 03-15-2015 11 x 7 mm LEFT A1-2 junction aneurysm, cause of RIGHT frontal lobe hemorrhage. Partially characterized RIGHT parietal AVM, and enlarged RIGHT middle cerebral  feeding arteries. Large draining vein to the superior sagittal sinus seen on MRI.   Dg Chest Port 1 View 03/15/15 1. Left IJ line noted ending about the distal SVC. 2. Mild left basilar airspace opacity likely reflects atelectasis. Lungs otherwise grossly clear.  02/22/2015 1. Endotracheal tube seen ending 4-5 cm above the carina. 2. Enteric tube noted extending below the diaphragm. 3. Vascular congestion, with increased interstitial markings, more prominent on the left, new from the recent prior study. This may reflect mildly asymmetric interstitial edema.   EEG abnormal electroencephalogram secondary to the decreased amplitude noted over the right hemisphere. This is consistent with the patient's history of a right ICH. No epileptiform activity is noted.      HISTORY OF PRESENT ILLNESS Dennis Anthony is an 49 y.o. male hx of bipolar disorder, polysubstance abuse transferred from [facility] ED after presenting with AMS and questionable seizure episode. BP upon arrival was 156/110. CT head performed at that time (imaging reviewed) which showed a 6.6 x 3.8 cm right inferior frontal lobe ICH with 6mm right-to-left subfalcine herniation. Patient was intubated in the ED for airway protection. Prior to intubation, ED attending noted patient was minimally following commands and moving right side. ED attending (Dr Blinda Leatherwood) discussed case and reviewed images with neurosurgery (Dr Othelia Pulling) who did not feel that the patient required immediate surgical intervention.   Date last known well: 03/21/2015 Time last known well: unclear tPA Given: no, ICH Modified Rankin: Rankin Score=0  ICH Score: 3   HOSPITAL COURSE Mr. Dennis Anthony is a 49 y.o. male with history of bipolar disorder, polysubstance abuse presenting with possible seizure. CT in ED with R frontal ICH with 6mm right to left Brain herniation. Intubated in the ED.   Right frontal ICH/SAH due to ruptured ACOM aneurysm with resultant  cerebral edema and subfalcine herniation s/p clipping with hematoma evacuation.   Resultant left hemiplegia  MRI Enlarging R inferior frontal lobe ICH, with worsening R to L subfalcine herniation  MRA and CTA L ACOM aneurysm, RIGHT parietal AVM with enlarged RIGHT middle cerebral feeding arteries.  S/p aneurysm clipping and hematoma evacuation  Repeat CTA showed residue 5x3 mm aneurysm  EEG low amplitude over the right hemisphere, consistent with the patient's history of a right ICH.  Transcranial Doppler increased velocity of right MCA  On nimodipine for vasospasm prevention  SCDs for VTE prophylaxis  Diet NPO time specified, on TF  no antithrombotic prior to admission, no antithrombotic due to ICH and SAH  Therapy recommendations: pending   Disposition: Pending, family declined PEG and trach, plan for one way extubation  Cytotoxic Cerebral Edema  off on 3% sodium now  Na 149 ->  147  CT repeat showed decreased midline shift post evacuation  Right parietal AVM   Likely chronic and not related to current ICH/SAH  NSG follow up as outpt  Acute Respiratory Failure  Intubated in ED  On vent  Not able to wean off vent so far  Family refused trach and PEG  One way extubation after discuss with brothers  Hypertension/hypotension  Off pressor  BP goal < 180 for vasospasm prevention  Other Active Problems  bipoloar disorder/schizoaffective disorder  Right AKA  DISCHARGE EXAM Pt deceased.   25 minutes were spent preparing discharge.  Marvel Plan, MD PhD Stroke Neurology 03/13/2015 5:38 PM

## 2015-03-25 NOTE — Telephone Encounter (Signed)
[facility] called and need to know if Dr. Roda Shutters is pts primary to sign death certificate. Please call [name] at [phone]-Located in [place]

## 2015-03-25 NOTE — Progress Notes (Signed)
Pt unresponsive in bed. No respirations, no pulse, no heart or breath sounds auscultated. Verified by Herma Ard RN. Family at bedside. [employer] notified.

## 2015-03-25 NOTE — Telephone Encounter (Signed)
Rn call funeral home at [phone] in [place] about patients death certification. Rn left message for Eunice Blase to call back tomorrow about clarification.

## 2015-03-25 NOTE — Discharge Summary (Signed)
Stroke Discharge Summary  Patient ID: Dennis Anthony    l   MRN: [id number]      DOB: 07/17/1965  Date of Admission: 03/19/2015 Date of Discharge: 02/27/2015  Attending Physician:  No att. providers found, Stroke MD  Consulting Physician(s):   Treatment Team:  Ernesto Botero, MD Neelesh Nundkumar, MD pulmonary/intensive care  Patient's PCP:  No PCP Per Patient  DISCHARGE DIAGNOSIS:  Active Problems:   Right frontal hemorrhagic stroke (HCC)   ICH (intracerebral hemorrhage) (HCC)   Ruptured ACOM aneurysm   Right parietal AVM   Cytotoxic brain edema (HCC)   Brain herniation (HCC)   Obstructive hydrocephalus   Coma (HCC)   Acute respiratory failure (HCC)   Cerebral vasospasm   BMI: Body mass index is 27.66 kg/(m^2).  Past Medical History  Diagnosis Date  . Bipolar disorder (HCC)   . Schizoaffective disorder (HCC)   . Polysubstance dependence (HCC)   . H/O suicide attempt June 2016    per brother pt jumped off back porch; wasn't taking his medications appropriately   Past Surgical History  Procedure Laterality Date  . Ankle fracture surgery Left     7-8 years ago per family  . Radiology with anesthesia N/A 03/11/2015    Procedure: RADIOLOGY WITH ANESTHESIA;  Surgeon: Neelesh Nundkumar, MD;  Location: [facility] OR;  Service: Radiology;  Laterality: N/A;  . Craniotomy Right 02/26/2015    Procedure: CRANIOTOMY INTRACRANIAL ANEURYSM ;  Surgeon: Neelesh Nundkumar, MD;  Location: [facility];  Service: Neurosurgery;  Laterality: Right;      Medication List    ASK your doctor about these medications        clonazePAM 0.5 MG tablet  Commonly known as:  KLONOPIN  Take 0.5 mg by mouth 2 (two) times daily as needed for anxiety.     cloZAPine 100 MG tablet  Commonly known as:  CLOZARIL  Take 500 mg by mouth at bedtime.     docusate sodium 100 MG capsule  Commonly known as:  COLACE  Take 100 mg by mouth daily.     ibuprofen 200 MG tablet  Commonly known as:  ADVIL,MOTRIN  Take 400  mg by mouth every 4 (four) hours as needed for mild pain.     omeprazole 20 MG capsule  Commonly known as:  PRILOSEC  Take 20 mg by mouth daily.     risperiDONE 1 MG tablet  Commonly known as:  RISPERDAL  Take 1 mg by mouth 2 (two) times daily.        LABORATORY STUDIES CBC    Component Value Date/Time   WBC 19.0* 03/03/2015 0500   RBC 4.41 03/03/2015 0500   HGB 12.5* 03/03/2015 0500   HCT 39.9 03/03/2015 0500   PLT 215 03/03/2015 0500   MCV 90.5 03/03/2015 0500   MCH 28.3 03/03/2015 0500   MCHC 31.3 03/03/2015 0500   RDW 16.3* 03/03/2015 0500   LYMPHSABS 3.7 03/14/2015 2121   MONOABS 1.7* 03/08/2015 2121   EOSABS 0.3 03/16/2015 2121   BASOSABS 0.1 03/15/2015 2121   CMP    Component Value Date/Time   NA 147* 03/03/2015 0500   K 4.0 03/03/2015 0500   CL 115* 03/03/2015 0500   CO2 24 03/03/2015 0500   GLUCOSE 148* 03/03/2015 0500   BUN 14 03/03/2015 0500   CREATININE 0.57* 03/03/2015 0500   CALCIUM 8.7* 03/03/2015 0500   PROT 7.8 03/01/2015 2121   ALBUMIN 4.0 03/03/2015 2121   AST 22 03/14/2015 2121   ALT   17 02/27/2015 2121   ALKPHOS 98 03/08/2015 2121   BILITOT 0.5 03/23/2015 2121   GFRNONAA >60 03/03/2015 0500   GFRAA >60 03/03/2015 0500   COAGS Lab Results  Component Value Date   INR 1.11 03/11/2015   Lipid Panel    Component Value Date/Time   TRIG 123 03/02/2015 0415   HgbA1C No results found for: HGBA1C Cardiac Panel (last 3 results) No results for input(s): CKTOTAL, CKMB, TROPONINI, RELINDX in the last 72 hours. Urinalysis    Component Value Date/Time   COLORURINE YELLOW 02/26/2015 0838   APPEARANCEUR CLEAR 02/26/2015 0838   LABSPEC 1.018 02/26/2015 0838   PHURINE 5.5 02/26/2015 0838   GLUCOSEU NEGATIVE 02/26/2015 0838   HGBUR SMALL* 02/26/2015 0838   BILIRUBINUR NEGATIVE 02/26/2015 0838   KETONESUR NEGATIVE 02/26/2015 0838   PROTEINUR NEGATIVE 02/26/2015 0838   UROBILINOGEN 1.0 02/26/2015 0838   NITRITE NEGATIVE 02/26/2015 0838    LEUKOCYTESUR NEGATIVE 02/26/2015 0838   Urine Drug Screen     Component Value Date/Time   LABOPIA NONE DETECTED 02/28/2015 2200   COCAINSCRNUR NONE DETECTED 02/27/2015 2200   LABBENZ NONE DETECTED 03/10/2015 2200   AMPHETMU NONE DETECTED 03/15/2015 2200   THCU NONE DETECTED 03/12/2015 2200   LABBARB NONE DETECTED 03/05/2015 2200    Alcohol Level    Component Value Date/Time   ETH 8* 02/26/2015 2121     SIGNIFICANT DIAGNOSTIC STUDIES Ct Angio Head W/cm &/or Wo Cm  02/28/2015 IMPRESSION: 1. Sequelae of recent anterior communicating artery aneurysm clipping. Small residual aneurysm base measuring 5 x 3 mm as above. 2. No evidence of significant intracranial arterial stenosis. Mild narrowing of both A1 segments and minimal bilateral M2 narrowing. 3. Right frontal parenchymal hematoma with surrounding edema, not significantly changed from yesterday's head CT. 4. Small volume intraventricular hemorrhage, slightly decreased in the interim. 5. Slightly increased size of low-density left subdural fluid collection suggestive of hygroma. 6. Incomplete CT perfusion processing at this time due to technical reasons. No clearly significant abnormal perfusion on the limited data available at this time. Full analysis will be performed when software errors are corrected.   03/02/2015 Right anterior frontal lobe hematoma has increased slightly in size now 7.2 x 4.4 cm versus prior 7 x 4.4 cm. Midline shift to the left by 1.3 cm versus prior 1.2 cm. Moderate to large amount of intraventricular blood. Trapping of the temporal horns greater on the left. More evident is subarachnoid blood and/ or pseudo subarachnoid appearance from diffuse edema. Calcification right parietal lobe consistent with underlying arteriovenous malformation. 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 Enlarged right middle cerebral artery branch supplying right parietal lobe larger arteriovenous  malformation. Draining veins towards the superior sagittal sinus. Mild calcification right internal carotid artery cavernous segment without high-grade stenosis as questioned on MR. Proximal basilar artery fenestration without saccular aneurysm.  Dg Chest 2 View 02/25/2015 1. No acute cardiopulmonary process seen. 2. Multiple chronic left-sided rib deformities noted.   Ct Head Wo Contrast 03/01/2015 Right anterior frontal lobe hematoma has increased slightly in size now 7.2 x 4.4 cm versus prior 7 x 4.4 cm. Midline shift to the left by 1.3 cm versus prior 1.2 cm. Moderate to large amount of intraventricular blood. Trapping of the temporal horns greater on the left. More evident is subarachnoid blood and/ or pseudo subarachnoid appearance from diffuse edema. Calcification right parietal lobe consistent with underlying arteriovenous malformation. Complex bilobed anterior communicating artery 11.4 x 8 x 6.4 mm   aneurysm. Enlarged right middle cerebral artery branch supplying right parietal lobe larger arteriovenous malformation. Draining veins towards the superior sagittal sinus. Mild calcification right internal carotid artery cavernous segment without high-grade stenosis as questioned on MR. Proximal basilar artery fenestration without saccular aneurysm  03/10/2015 6.6 x 3.8 cm hematoma, resulting in 6 mm RIGHT to LEFT subfalcine herniation. Intraventricular extension without hydrocephalus. 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   MRI HEAD 03/20/2015 Enlarging 7 x 4.4 cm RIGHT inferior frontal lobe intraparenchymal hematoma, worsening mass-effect with 12 mm RIGHT to LEFT subfalcine herniation. Intraventricular extension of hemorrhage, new mild hydrocephalus.   MRA HEAD 03/05/2015 11 x 7 mm LEFT A1-2 junction aneurysm, cause of RIGHT frontal lobe hemorrhage. Partially characterized RIGHT parietal AVM, and enlarged RIGHT middle cerebral  feeding arteries. Large draining vein to the superior sagittal sinus seen on MRI.   Dg Chest Port 1 View 03/01/2015 1. Left IJ line noted ending about the distal SVC. 2. Mild left basilar airspace opacity likely reflects atelectasis. Lungs otherwise grossly clear.  03/20/2015 1. Endotracheal tube seen ending 4-5 cm above the carina. 2. Enteric tube noted extending below the diaphragm. 3. Vascular congestion, with increased interstitial markings, more prominent on the left, new from the recent prior study. This may reflect mildly asymmetric interstitial edema.   EEG abnormal electroencephalogram secondary to the decreased amplitude noted over the right hemisphere. This is consistent with the patient's history of a right ICH. No epileptiform activity is noted.      HISTORY OF PRESENT ILLNESS Dennis Anthony is an 49 y.o. male hx of bipolar disorder, polysubstance abuse transferred from [place] ED after presenting with AMS and questionable seizure episode. BP upon arrival was 156/110. CT head performed at that time (imaging reviewed) which showed a 6.6 x 3.8 cm right inferior frontal lobe ICH with 6mm right-to-left subfalcine herniation. Patient was intubated in the ED for airway protection. Prior to intubation, ED attending noted patient was minimally following commands and moving right side. ED attending (Dr Pollina) discussed case and reviewed images with neurosurgery (Dr Bottero) who did not feel that the patient required immediate surgical intervention.   Date last known well: 03/20/2015 Time last known well: unclear tPA Given: no, ICH Modified Rankin: Rankin Score=0  ICH Score: 3   HOSPITAL COURSE Mr. Mahmud Saxton is a 49 y.o. male with history of bipolar disorder, polysubstance abuse presenting with possible seizure. CT in ED with R frontal ICH with 6mm right to left Brain herniation. Intubated in the ED.   Right frontal ICH/SAH due to ruptured ACOM aneurysm with resultant  cerebral edema and subfalcine herniation s/p clipping with hematoma evacuation.   Resultant left hemiplegia  MRI Enlarging R inferior frontal lobe ICH, with worsening R to L subfalcine herniation  MRA and CTA L ACOM aneurysm, RIGHT parietal AVM with enlarged RIGHT middle cerebral feeding arteries.  S/p aneurysm clipping and hematoma evacuation  Repeat CTA showed residue 5x3 mm aneurysm  EEG low amplitude over the right hemisphere, consistent with the patient's history of a right ICH.  Transcranial Doppler increased velocity of right MCA  On nimodipine for vasospasm prevention  SCDs for VTE prophylaxis  Diet NPO time specified, on TF  no antithrombotic prior to admission, no antithrombotic due to ICH and SAH  Therapy recommendations: pending   Disposition: Pending, family declined PEG and trach, plan for one way extubation  Cytotoxic Cerebral Edema  off on 3% sodium now  Na 149 ->   147  CT repeat showed decreased midline shift post evacuation  Right parietal AVM   Likely chronic and not related to current ICH/SAH  NSG follow up as outpt  Acute Respiratory Failure  Intubated in ED  On vent  Not able to wean off vent so far  Family refused trach and PEG  One way extubation after discuss with brothers  Hypertension/hypotension  Off pressor  BP goal < 180 for vasospasm prevention  Other Active Problems  bipoloar disorder/schizoaffective disorder  Right AKA  DISCHARGE EXAM Pt deceased.   25 minutes were spent preparing discharge.  Tangia Pinard, MD PhD Stroke Neurology 03/03/2015 5:38 PM    

## 2015-03-25 DEATH — deceased

## 2015-04-03 ENCOUNTER — Ambulatory Visit: Payer: Self-pay | Admitting: Gastroenterology

## 2016-01-27 IMAGING — CR DG CHEST 1V PORT
2 series · 2 of 2 positions shown · non-contrast
Comparison: Portable chest x-ray March 02, 2015

CLINICAL DATA: Intubated patient for respiratory failure,
hemorrhagic stroke with brain herniation,

EXAM:
PORTABLE CHEST 1 VIEW

[AP (1 of 2)]
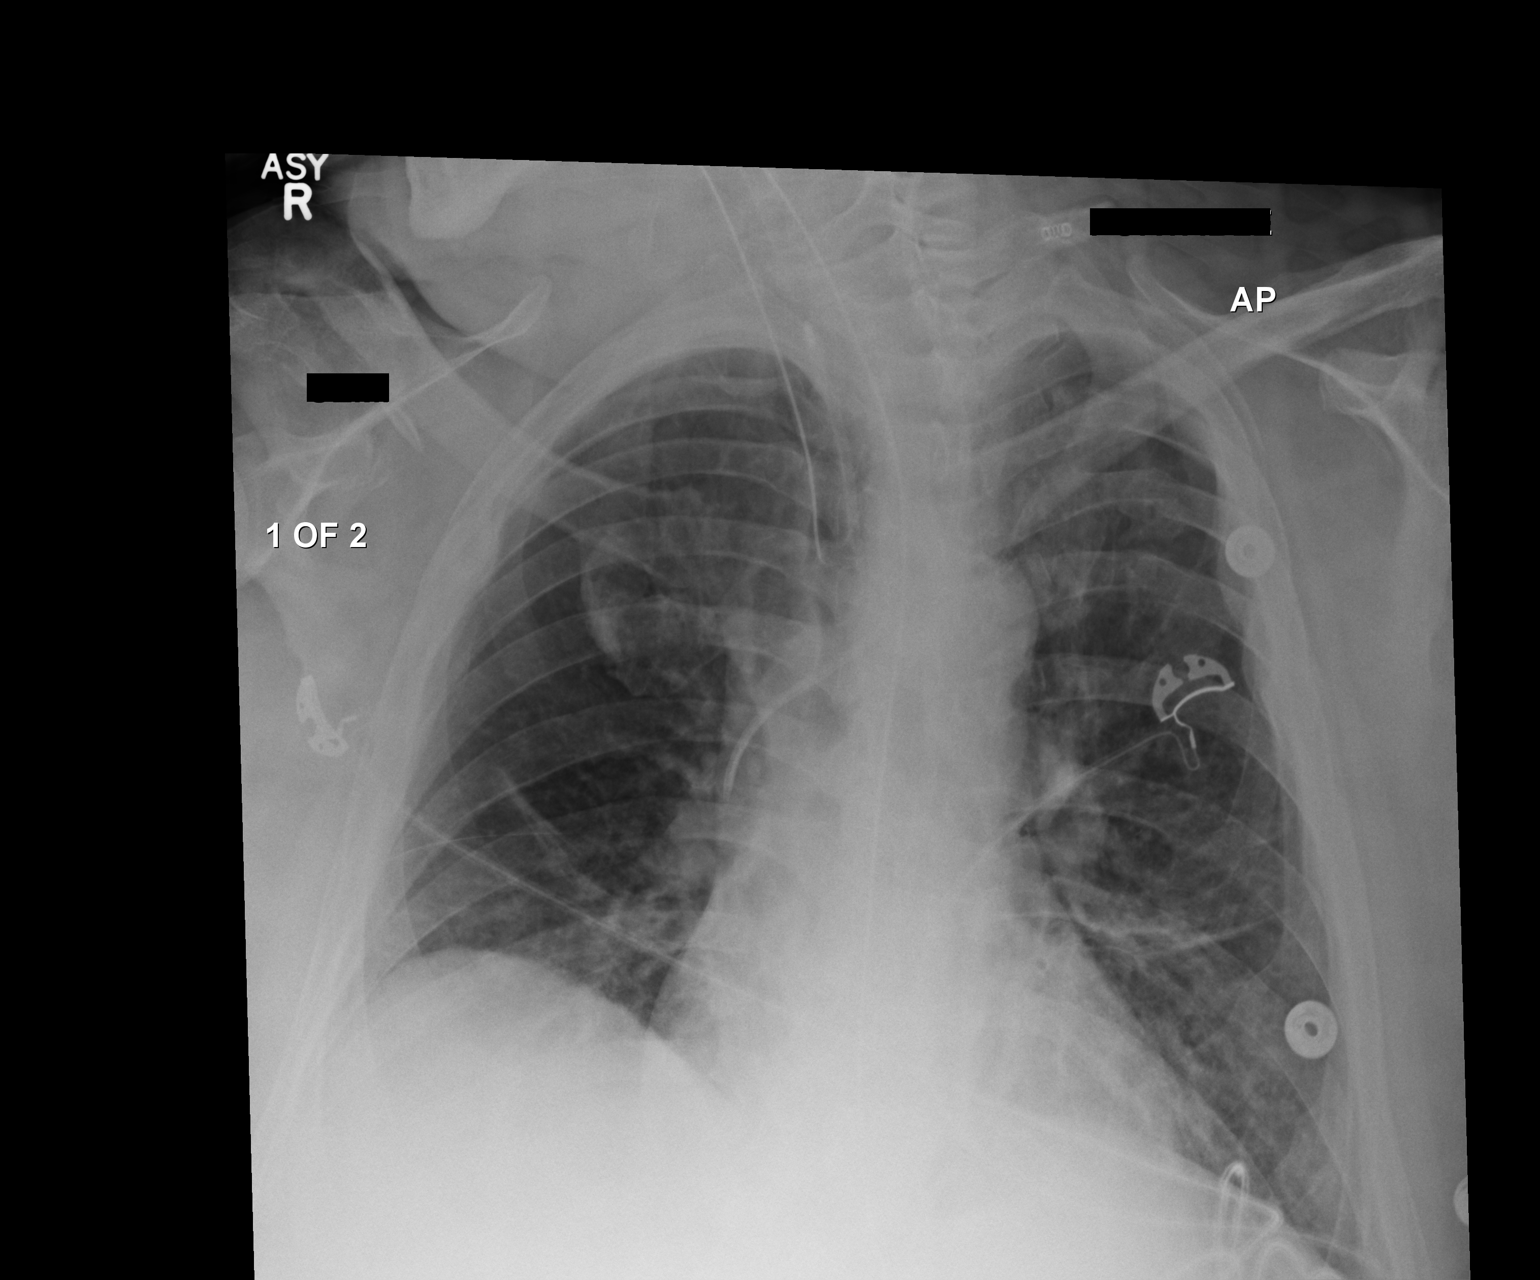

[AP (2 of 2)]
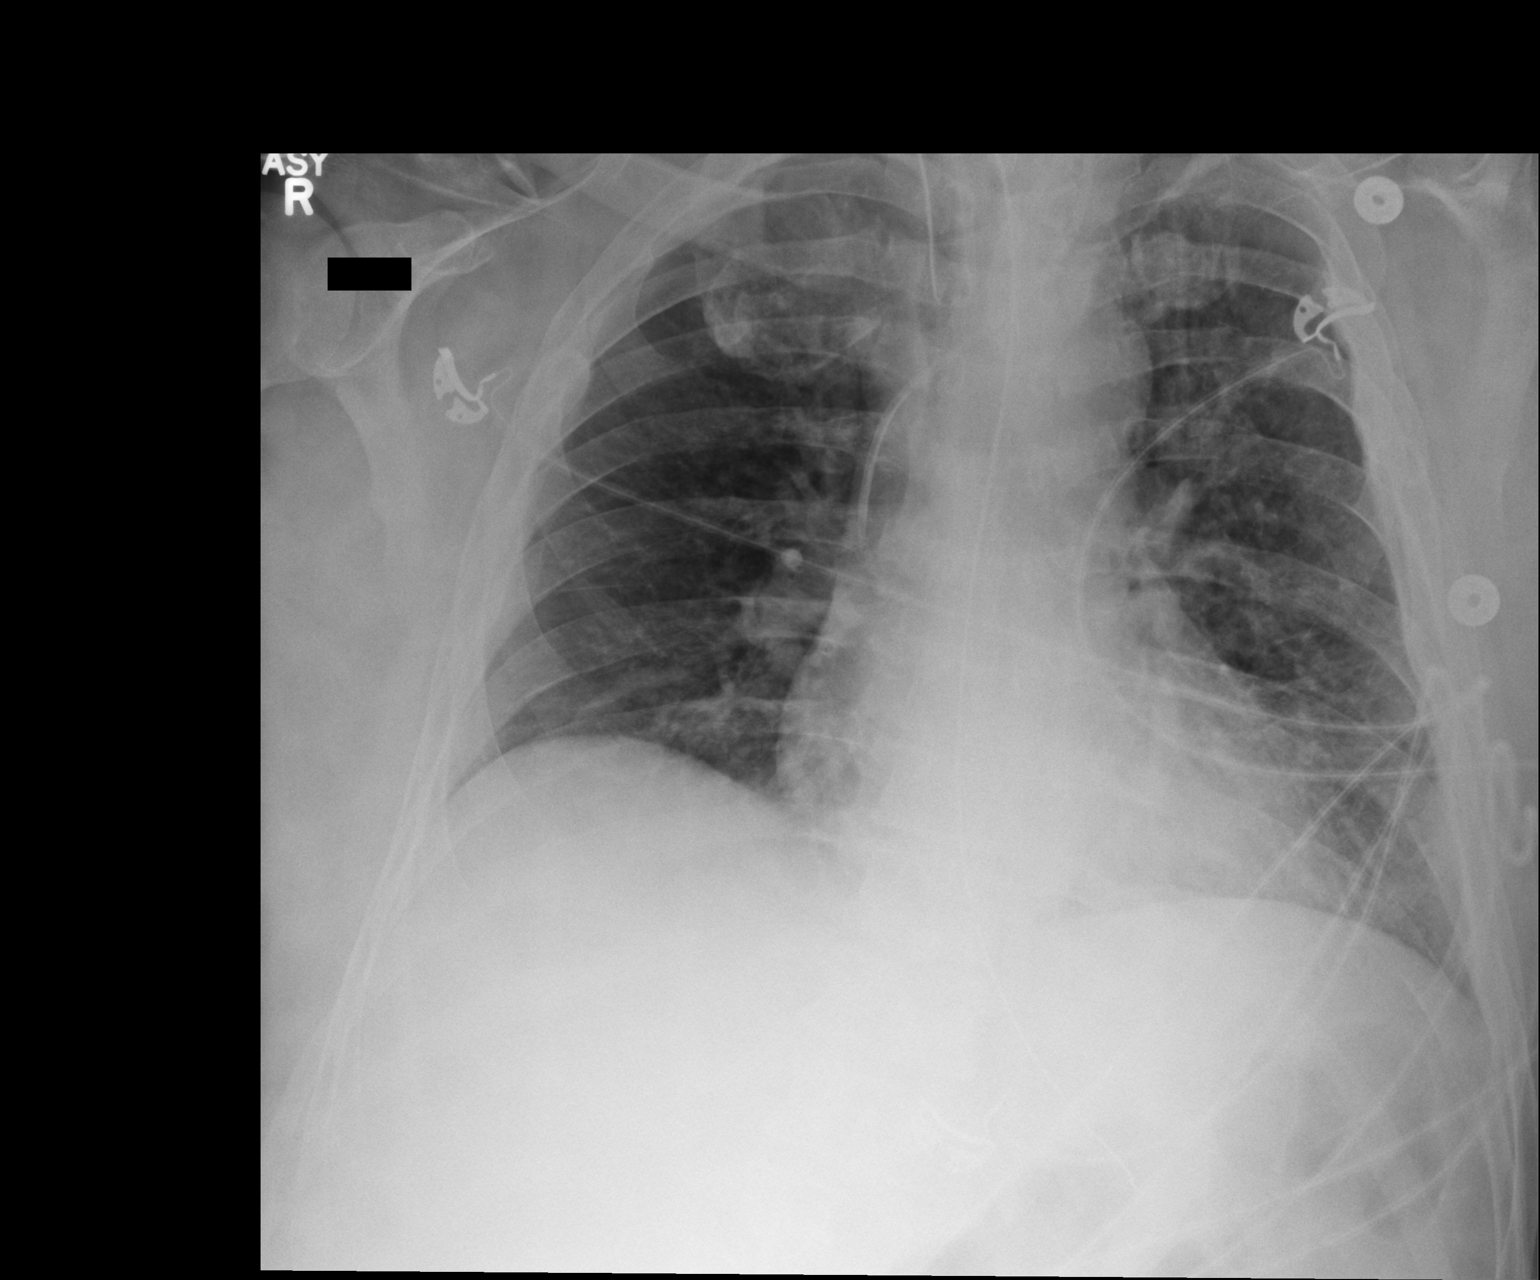

[2 of 2 positions shown; findings below may reference images not displayed]

FINDINGS: The lungs are reasonably well inflated. The interstitial markings
are coarse on the left but less conspicuous on the right. There are
areas of subsegmental atelectasis in the perihilar and infrahilar
regions bilaterally. There is no pleural effusion. The heart is
normal in size. The pulmonary vascularity is mildly engorged but
stable. The endotracheal tube tip lies 4.7 cm above the carina. The
esophagogastric tube tip projects below the inferior margin of the
image. The left internal jugular venous catheter tip projects over
the midportion of the SVC. There are old bilateral rib fractures
with associated pleural thickening.
IMPRESSION: There is stable bibasilar and perihilar subsegmental atelectasis.
Some improvement in the pulmonary interstitium especially on the
right has occurred. The support tubes are in reasonable position.
